# Patient Record
Sex: Male | Born: 1980 | Race: Black or African American | Hispanic: No | Marital: Single | Smoking: Former smoker
Health system: Southern US, Community
[De-identification: ages and names within clinical notes are randomized; demographics above are authoritative.]

## PROBLEM LIST (undated history)

## (undated) DIAGNOSIS — F32A Depression, unspecified: Secondary | ICD-10-CM

## (undated) DIAGNOSIS — Z21 Asymptomatic human immunodeficiency virus [HIV] infection status: Secondary | ICD-10-CM

## (undated) DIAGNOSIS — F329 Major depressive disorder, single episode, unspecified: Secondary | ICD-10-CM

## (undated) DIAGNOSIS — B20 Human immunodeficiency virus [HIV] disease: Secondary | ICD-10-CM

## (undated) HISTORY — DX: Major depressive disorder, single episode, unspecified: F32.9

## (undated) HISTORY — DX: Human immunodeficiency virus (HIV) disease: B20

## (undated) HISTORY — DX: Depression, unspecified: F32.A

## (undated) HISTORY — DX: Asymptomatic human immunodeficiency virus (hiv) infection status: Z21

---

## 2001-04-11 ENCOUNTER — Emergency Department (HOSPITAL_COMMUNITY): Admission: EM | Admit: 2001-04-11 | Discharge: 2001-04-11 | Payer: Self-pay | Admitting: Emergency Medicine

## 2001-04-27 ENCOUNTER — Emergency Department (HOSPITAL_COMMUNITY): Admission: EM | Admit: 2001-04-27 | Discharge: 2001-04-27 | Payer: Self-pay | Admitting: Emergency Medicine

## 2003-02-10 ENCOUNTER — Emergency Department (HOSPITAL_COMMUNITY): Admission: EM | Admit: 2003-02-10 | Discharge: 2003-02-10 | Payer: Self-pay | Admitting: Emergency Medicine

## 2004-03-25 ENCOUNTER — Emergency Department (HOSPITAL_COMMUNITY): Admission: EM | Admit: 2004-03-25 | Discharge: 2004-03-25 | Payer: Self-pay | Admitting: Emergency Medicine

## 2007-04-06 ENCOUNTER — Emergency Department (HOSPITAL_COMMUNITY): Admission: EM | Admit: 2007-04-06 | Discharge: 2007-04-06 | Payer: Self-pay | Admitting: Emergency Medicine

## 2008-08-12 ENCOUNTER — Emergency Department (HOSPITAL_BASED_OUTPATIENT_CLINIC_OR_DEPARTMENT_OTHER): Admission: EM | Admit: 2008-08-12 | Discharge: 2008-08-13 | Payer: Self-pay | Admitting: Emergency Medicine

## 2008-08-15 ENCOUNTER — Emergency Department (HOSPITAL_BASED_OUTPATIENT_CLINIC_OR_DEPARTMENT_OTHER): Admission: EM | Admit: 2008-08-15 | Discharge: 2008-08-15 | Payer: Self-pay | Admitting: Emergency Medicine

## 2008-08-18 ENCOUNTER — Emergency Department (HOSPITAL_BASED_OUTPATIENT_CLINIC_OR_DEPARTMENT_OTHER): Admission: EM | Admit: 2008-08-18 | Discharge: 2008-08-18 | Payer: Self-pay | Admitting: Emergency Medicine

## 2014-10-26 ENCOUNTER — Encounter (HOSPITAL_COMMUNITY): Payer: Self-pay | Admitting: *Deleted

## 2014-10-26 ENCOUNTER — Emergency Department (HOSPITAL_COMMUNITY)
Admission: EM | Admit: 2014-10-26 | Discharge: 2014-10-26 | Disposition: A | Discharge status: Home / Self Care | Payer: Self-pay | Attending: Emergency Medicine | Admitting: Emergency Medicine

## 2014-10-26 ENCOUNTER — Emergency Department (HOSPITAL_COMMUNITY): Payer: Self-pay

## 2014-10-26 DIAGNOSIS — K602 Anal fissure, unspecified: Secondary | ICD-10-CM | POA: Insufficient documentation

## 2014-10-26 DIAGNOSIS — R109 Unspecified abdominal pain: Secondary | ICD-10-CM

## 2014-10-26 DIAGNOSIS — K5900 Constipation, unspecified: Secondary | ICD-10-CM | POA: Insufficient documentation

## 2014-10-26 LAB — POC OCCULT BLOOD, ED: FECAL OCCULT BLD: POSITIVE — AB

## 2014-10-26 MED ORDER — MAGNESIUM CITRATE PO SOLN
1.0000 | Freq: Once | ORAL | Status: AC
  Administered 2014-10-26: 1 via ORAL
  Filled 2014-10-26: qty 296

## 2014-10-26 MED ORDER — LIDOCAINE 5 % EX OINT: 1.0000 "application " | TOPICAL_OINTMENT | CUTANEOUS | Status: DC | PRN

## 2014-10-26 MED ORDER — HYDROCORTISONE 1 % EX CREA: TOPICAL_CREAM | CUTANEOUS | Status: DC

## 2014-10-26 NOTE — ED Provider Notes (Signed)
CSN: [id number]     Arrival date & time 10/26/14  1509 History   First MD Initiated Contact with Patient 10/26/14 2014     Chief Complaint  Patient presents with  . Constipation  . Rectal Bleeding     (Consider location/radiation/quality/duration/timing/severity/associated sxs/prior Treatment) HPI Frank Moore is a 34 y.o. male with no medical problems, presents to ED with complaint or rectal pain blood when he wipes. States symptoms started 3 days ago. States he has been constipated and straining a lot. Patient states it's painful when he has a bowel movement and when he wipes his rectum. He denies any fever or chills. He denies any abdominal pain. No nausea or vomiting. No treatment prior to coming in.  History reviewed. No pertinent past medical history. History reviewed. No pertinent past surgical history. History reviewed. No pertinent family history. Social History  Substance Use Topics  . Smoking status: Never Smoker   . Smokeless tobacco: None  . Alcohol Use: No    Review of Systems  Constitutional: Negative for fever and chills.  Respiratory: Negative for cough, chest tightness and shortness of breath.   Cardiovascular: Negative for chest pain, palpitations and leg swelling.  Gastrointestinal: Positive for constipation, blood in stool, anal bleeding and rectal pain. Negative for nausea, vomiting, abdominal pain, diarrhea and abdominal distention.  Genitourinary: Negative for dysuria, urgency, frequency and hematuria.  Musculoskeletal: Negative for myalgias, arthralgias, neck pain and neck stiffness.  Skin: Negative for rash.  Allergic/Immunologic: Negative for immunocompromised state.  Neurological: Negative for dizziness, weakness, light-headedness, numbness and headaches.  All other systems reviewed and are negative.     Allergies  Review of patient's allergies indicates not on file.  Home Medications   Prior to Admission medications   Not on File   BP 125/75  mmHg  Pulse 71  Temp(Src) 98.8 F (37.1 C) (Oral)  Resp 18  SpO2 100% Physical Exam  Constitutional: He appears well-developed and well-nourished. No distress.  HENT:  Head: Normocephalic and atraumatic.  Eyes: Conjunctivae are normal.  Neck: Neck supple.  Cardiovascular: Normal rate, regular rhythm and normal heart sounds.   Pulmonary/Chest: Effort normal. No respiratory distress. He has no wheezes. He has no rales.  Abdominal: Soft. Bowel sounds are normal. He exhibits no distension. There is no tenderness. There is no rebound.  Genitourinary:  Fissure noted to the rectum with bright red bleeding from the fissure. No findings a digital exam. No external hemorrhoids.  Musculoskeletal: He exhibits no edema.  Neurological: He is alert.  Skin: Skin is warm and dry.  Nursing note and vitals reviewed.   ED Course  Procedures (including critical care time) Labs Review Labs Reviewed  POC OCCULT BLOOD, ED - Abnormal; Notable for the following:    Fecal Occult Bld POSITIVE (*)    All other components within normal limits    Imaging Review Dg Abd 2 Views  10/26/2014   CLINICAL DATA:  abd pain constipation and bleeding with BM  EXAM: ABDOMEN - 2 VIEW  COMPARISON:  None.  FINDINGS: The bowel gas pattern is normal. Moderate stool burden. There is no evidence of free air. No radio-opaque calculi or other significant radiographic abnormality is seen.  IMPRESSION: 1. Nonobstructed bowel gas pattern. 2. Moderate stool burden.   Electronically Signed   By: Norva Pavlov M.D.   On: 10/26/2014 21:13   I have personally reviewed and evaluated these images and lab results as part of my medical decision-making.   EKG Interpretation None  MDM   Final diagnoses:  Abdominal pain  Rectal fissure  Constipation, unspecified constipation type   Patient with constipation and rectal pain. Also reports bright red blood on tissue when he wipes. Exam shows fissure. Abdominal x-ray shows  constipation. Patient is afebrile, nontoxic appearing. Although considered rectal abscess, doubt given normal vital signs and temperature, no history of the same, and clearly visible fissure. Patient has no abdominal pain. I doubt colitis or diverticulitis as the cause of his rectal bleeding. Will treat with laxity of the stool softeners, topical lidocaine and hydrocortisone cream, sitz baths. Follow up with PCP. Return precautions discussed.   Filed Vitals:   10/26/14 2011  BP: 125/75  Pulse: 71  Temp: 98.8 F (37.1 C)  TempSrc: Oral  Resp: 18  SpO2: 100%      Jaynie Crumble, PA-C 10/26/14 2249  Richardean Canal, MD 10/27/14 [phone]

## 2014-10-26 NOTE — ED Notes (Signed)
Nurse first rounds, pt updated in wait times, no needs at this time.

## 2014-10-26 NOTE — ED Notes (Signed)
Patient transported to X-ray 

## 2014-10-26 NOTE — ED Notes (Signed)
Patient reports constipation and discomfort while trying to have bowel movement. Patient reports straining. States he has now noticed blood in his stool. Denies any abdominal pain. Pain only when using restroom.

## 2014-10-26 NOTE — Discharge Instructions (Signed)
Apply lidocaine cream for pain. Hydrocortisone cream for swelling and inflammation. Start taking daily stool softeners, soldover-the-counter. Take magnesium citrate to empty your bowels for now. Follow-up with your doctor. Return if fever, worsening pain, worsening bleeding     Anal Fissure, Adult An anal fissure is a small tear or crack in the skin around the anus. Bleeding from a fissure usually stops on its own within a few minutes. However, bleeding will often reoccur with each bowel movement until the crack heals.  CAUSES   Passing large, hard stools.  Frequent diarrheal stools.  Constipation.  Inflammatory bowel disease (Crohn's disease or ulcerative colitis).  Infections.  Anal sex. SYMPTOMS   Small amounts of blood seen on your stools, on toilet paper, or in the toilet after a bowel movement.  Rectal bleeding.  Painful bowel movements.  Itching or irritation around the anus. DIAGNOSIS Your caregiver will examine the anal area. An anal fissure can usually be seen with careful inspection. A rectal exam may be performed and a short tube (anoscope) may be used to examine the anal canal. TREATMENT   You may be instructed to take fiber supplements. These supplements can soften your stool to help make bowel movements easier.  Sitz baths may be recommended to help heal the tear. Do not use soap in the sitz baths.  A medicated cream or ointment may be prescribed to lessen discomfort. HOME CARE INSTRUCTIONS   Maintain a diet high in fruits, whole grains, and vegetables. Avoid constipating foods like bananas and dairy products.  Take sitz baths as directed by your caregiver.  Drink enough fluids to keep your urine clear or pale yellow.  Only take over-the-counter or prescription medicines for pain, discomfort, or fever as directed by your caregiver. Do not take aspirin as this may increase bleeding.  Do not use ointments containing numbing medications (anesthetics) or  hydrocortisone. They could slow healing. SEEK MEDICAL CARE IF:   Your fissure is not completely healed within 3 days.  You have further bleeding.  You have a fever.  You have diarrhea mixed with blood.  You have pain.  Your problem is getting worse rather than better. MAKE SURE YOU:   Understand these instructions.  Will watch your condition.  Will get help right away if you are not doing well or get worse. Document Released: 01/20/2005 Document Revised: 04/14/2011 Document Reviewed: 07/07/2010 [facility] Patient Information 2015 [place], [place]. This information is not intended to replace advice given to you by your health care provider. Make sure you discuss any questions you have with your health care provider.

## 2015-03-27 ENCOUNTER — Encounter (HOSPITAL_COMMUNITY): Payer: Self-pay | Admitting: *Deleted

## 2015-03-27 ENCOUNTER — Emergency Department (HOSPITAL_COMMUNITY): Payer: BLUE CROSS/BLUE SHIELD

## 2015-03-27 ENCOUNTER — Emergency Department (HOSPITAL_COMMUNITY)
Admission: EM | Admit: 2015-03-27 | Discharge: 2015-03-27 | Disposition: A | Discharge status: Home / Self Care | Payer: BLUE CROSS/BLUE SHIELD | Attending: Emergency Medicine | Admitting: Emergency Medicine

## 2015-03-27 DIAGNOSIS — Y9389 Activity, other specified: Secondary | ICD-10-CM | POA: Diagnosis not present

## 2015-03-27 DIAGNOSIS — Y990 Civilian activity done for income or pay: Secondary | ICD-10-CM | POA: Insufficient documentation

## 2015-03-27 DIAGNOSIS — S79911A Unspecified injury of right hip, initial encounter: Secondary | ICD-10-CM | POA: Diagnosis present

## 2015-03-27 DIAGNOSIS — Y9289 Other specified places as the place of occurrence of the external cause: Secondary | ICD-10-CM | POA: Insufficient documentation

## 2015-03-27 DIAGNOSIS — Z7952 Long term (current) use of systemic steroids: Secondary | ICD-10-CM | POA: Insufficient documentation

## 2015-03-27 DIAGNOSIS — X500XXA Overexertion from strenuous movement or load, initial encounter: Secondary | ICD-10-CM | POA: Insufficient documentation

## 2015-03-27 DIAGNOSIS — S76011A Strain of muscle, fascia and tendon of right hip, initial encounter: Secondary | ICD-10-CM | POA: Insufficient documentation

## 2015-03-27 DIAGNOSIS — M25551 Pain in right hip: Secondary | ICD-10-CM

## 2015-03-27 MED ORDER — METHOCARBAMOL 500 MG PO TABS
1000.0000 mg | ORAL_TABLET | Freq: Once | ORAL | Status: AC
  Administered 2015-03-27: 1000 mg via ORAL
  Filled 2015-03-27: qty 2

## 2015-03-27 MED ORDER — OXYCODONE-ACETAMINOPHEN 5-325 MG PO TABS
1.0000 | ORAL_TABLET | Freq: Once | ORAL | Status: AC
  Administered 2015-03-27: 1 via ORAL
  Filled 2015-03-27: qty 1

## 2015-03-27 MED ORDER — METHOCARBAMOL 500 MG PO TABS: 500.0000 mg | ORAL_TABLET | Freq: Two times a day (BID) | ORAL | Status: DC

## 2015-03-27 MED ORDER — OXYCODONE-ACETAMINOPHEN 5-325 MG PO TABS: 1.0000 | ORAL_TABLET | ORAL | Status: DC | PRN

## 2015-03-27 MED ORDER — IBUPROFEN 800 MG PO TABS: 800.0000 mg | ORAL_TABLET | Freq: Three times a day (TID) | ORAL | Status: DC

## 2015-03-27 MED ORDER — KETOROLAC TROMETHAMINE 60 MG/2ML IM SOLN
60.0000 mg | Freq: Once | INTRAMUSCULAR | Status: AC
  Administered 2015-03-27: 60 mg via INTRAMUSCULAR
  Filled 2015-03-27: qty 2

## 2015-03-27 NOTE — ED Notes (Signed)
Patient transported to X-ray 

## 2015-03-27 NOTE — Discharge Instructions (Signed)
You have been seen today for hip pain. Your imaging showed no abnormalities. Follow up with PCP for chronic management of this issue. Return to ED should symptoms worsen or you begin to have symptoms such as fever/chills, nausea/vomiting, abdominal pain, testicular pain, numbness/tingling/weakness, or any other major concerns. Apply ice and take ibuprofen for pain and anti-inflammatory management. Decrease stress on the hip. Take it easy at work.   Emergency Department Resource Guide 1) Find a Doctor and Pay Out of Pocket Although you won't have to find out who is covered by your insurance plan, it is a good idea to ask around and get recommendations. You will then need to call the office and see if the doctor you have chosen will accept you as a new patient and what types of options they offer for patients who are self-pay. Some doctors offer discounts or will set up payment plans for their patients who do not have insurance, but you will need to ask so you aren't surprised when you get to your appointment.  2) Contact Your Local Health Department Not all health departments have doctors that can see patients for sick visits, but many do, so it is worth a call to see if yours does. If you don't know where your local health department is, you can check in your phone book. The CDC also has a tool to help you locate your state's health department, and many state websites also have listings of all of their local health departments.  3) Find a Walk-in Clinic If your illness is not likely to be very severe or complicated, you may want to try a walk in clinic. These are popping up all over the country in pharmacies, drugstores, and shopping centers. They're usually staffed by nurse practitioners or physician assistants that have been trained to treat common illnesses and complaints. They're usually fairly quick and inexpensive. However, if you have serious medical issues or chronic medical problems, these are  probably not your best option.  No Primary Care Doctor: - Call Health Connect at  [phone] - they can help you locate a primary care doctor that  accepts your insurance, provides certain services, etc. - Physician Referral Service- [phone]  Chronic Pain Problems: Organization         Address  Phone   Notes  [facility]  [phone] Patients need to be referred by their primary care doctor.   Medication Assistance: Organization         Address  Phone   Notes  [facility] Medication [facility] [address] [place] [place], [place] [address] [phone] --Must be a resident of [facility] -- Must have NO insurance coverage whatsoever (no Medicaid/ Medicare, etc.) -- The pt. MUST have a primary care doctor that directs their care regularly and follows them in the community   [facility]  [phone]   [employer]  [phone]    Agencies that provide inexpensive medical care: Organization         Address  Phone   Notes  [facility]  [phone]   [facility]    [phone]   [facility] [address] [place], [place] [address] [phone]   [facility] [address], [place] [phone]   Planned Parenthood    [phone]   [facility]    [phone]   [facility]  [address], [place] Phone:  [phone], Fax:  [phone] Hours of Operation:  9 am - 6 pm, M-F.  Also accepts Medicaid/Medicare and self-pay.  [facility]  [address], [place] Phone: [phone], Fax: [phone]. Hours of Operation:  8:30 am - 5:30 pm, M-F.  Also accepts Medicaid and self-pay.  [facility] [place] [address] [place] Phone: [phone]   [facility] [address] Natasha Bence [place], [place] [phone], Ext. [phone] Mondays & Thursdays:  7-9 AM.  First 15 patients are seen on a first come, first serve basis.    Medicaid-accepting [facility] Providers:  Organization         Address  Phone   Notes  [facility] [address],  [phone] Also accepts self-pay patients.  [facility] [address] Laurell Josephs [place], [place]  [phone]   [facility] [address], [place] [phone]   [facility] [address] [place] [phone]   Renaye Rakers [address], [place]   [phone] Only accepts [place] patients after they have their name applied to their card.   Self-Pay (no insurance) in [facility]:  Organization         Address  Phone   Notes  Sickle Cell Patients, [facility] [address] [place], [place] [phone]   [facility] Care [address] [place], [place] [phone]   [facility] [place]  [address], [place] [phone]   [facility]/Dr. Osei-Bonsu  [address] [place] or [address], [place] [phone] Phone number for both [place] and [place] locations is the same.  [facility] [address], [place] [phone]   [facility] [address] [place] or [address] [phone]   [facility] [address] [place] [phone], phone; [phone], fax Sees patients 1st and 3rd Saturday of every month.  Must not qualify for public or private insurance (i.e. Medicaid, Medicare, [place] Health Choice, Veterans' Benefits)  Household income should be no more than 200% of the poverty level The clinic cannot treat you if you are pregnant or think you are pregnant  Sexually transmitted diseases are not treated at the clinic.     Dental Care: Organization         Address  Phone  Notes  [facility] of [facility] [address] [place], [place] [phone] Accepts children up to age 25 who are enrolled in [place] or [place] Health Choice; pregnant women with a Medicaid card; and children who have applied for Medicaid or [place] Health Choice, but were declined, whose parents can pay a reduced fee at time of service.  [facility] Department of [facility]  [address], [place] [phone] Accepts children up to age 7 who are enrolled in [place] or [place] Health Choice; pregnant women with a Medicaid card; and children who have applied for Medicaid or  Health Choice, but were declined, whose parents can pay a reduced fee at time of service.  [facility] Adult Dental Access PROGRAM  [address] [place], [place] [phone]  Patients are seen by appointment only. Walk-ins are not accepted. [facility] will see patients 36 years of age and older. Monday - Tuesday (8am-5pm) Most Wednesdays (8:30-5pm) $30 per visit, cash only  [facility]  [address], [facility] [phone] Patients are seen by appointment only. Walk-ins are not accepted. [facility] will see patients 36 years of age and older. One Wednesday Evening (Monthly: Volunteer Based).  $30 per visit, cash only  [employer] [facility]  [phone] for adults; Children under age 634, call Graduate Pediatric Dentistry at [phone]. Children aged 924-14, please call [phone] to request a pediatric application.  Dental services are provided in all areas of dental care including fillings, crowns and bridges, complete and partial dentures, implants, gum treatment, root canals, and extractions. Preventive care is also provided. Treatment is provided to both adults and children. Patients are selected via a lottery and there is often a waiting  list.   [facility] [address], [place]  [phone] [email or web address]   [facility] [address], [place], [place] [phone], Ext. [phone] Second and Fourth Thursday of each month, opens at 6:30 AM; Clinic ends at 9 AM.  Patients are seen on a first-come first-served basis, and a limited number are seen during each clinic.   [facility]  [address], [place], [place] [phone]   Eligibility Requirements You must have lived in [place], [place], or [place] counties for at least the last three months.   You cannot be eligible for state or federal sponsored [place] Cityhealthcare insurance, including CIGNAVeterans Administration, [place]icaid, or [employer]icare.   You generally cannot be eligible for healthcare insurance through your employer.    How to apply: Eligibility screenings are held every Tuesday and Wednesday afternoon from 1:00 pm until 4:00 pm. You do not need an appointment for the interview!  [facility] [address], [place], [place] [phone]   [facility]  [phone]   [facility]  [phone]   [facility]  [phone]    Behavioral Health Resources in the Community: Intensive Outpatient Programs Organization         Address  Phone  Notes  [facility] [address], [place], [place] [phone]   [facility] [address], [place], [place] [phone]   [facility] [address], [place], [place]  [phone]   [facility] [address],  [place], [place] [phone] or [phone]   Substance Abuse Resources Organization         Address  Phone  Notes  [facility]  [phone]   [facility]  [phone]   [facility]  [phone]   [facility]  [phone]   Residential & Outpatient Substance Abuse Program   [phone]   Psychological Services Organization         Address  Phone  Notes  [facility]  [phone]- [phone]   [facility]  [phone]- [phone]   [facility] [address], [place] [phone] or [phone]    Mobile Crisis Teams Organization         Address  Phone  Notes  [facility], Mobile Crisis Care Unit  [phone]   [facility]  [address] [place], [place] [phone]   [facility] [address], [place]  [address] [place] [place] [phone]    Self-Help/Support Groups Organization         Address  Phone             Notes  [facility] [place] - variety of support groups  [phone]- [phone] Call for more information  Narcotics Anonymous (NA), [facility] [address], [employer] [place]  2 meetings at this location   Statistician         Address  Phone  Notes  [facility] [address],    [place]  [phone]   [facility]  [address], [place] [address], [place], [place] [phone]   [facility] [address] [place], [place] [phone] Admissions: 8am-3pm M-F  [facility] [address]    [place], [place] [phone]   [facility] [address] [place], [place], [place] [phone]   [facility] [address],  [place], [place] [phone]   [facility] - Intensive Outpatient [address]ure[address] Josephs [address], [place], [place] [phone]   [facility] ([facility] [address]  [place], [place] [phone] or [phone]   [facility] ([facility]) [address], [place], [place] [phone] Accepts Medicaid  [facility] [address],  [place] [phone] Substance Abuse/Addiction Treatment   [facility] Organization          Address  Phone  Notes  [facility]  [phone]   Angie Fava, PhD [address] Ervin Knack [place], [place]   [phone] or [phone]   [facility]   [address] [place], [place] [phone]   [facility] [address] [place], [place] [phone] Insurance/Medicaid/sponsorship through [facility] and Families [address]                                    [place], [place] [phone] Therapy/tele-psych/case  [facility] [address], [place] [phone]    Dr. Lolly Mustache  [phone]   [facility]  [employer] [facility]. 1) [address], [place] 2) [address] [place] 3)  [address], [place] [phone]  [phone]   [facility] Child Abuse Hotline [phone] or [phone] (After Hours)

## 2015-03-27 NOTE — ED Provider Notes (Signed)
CSN: [id number]     Arrival date & time 03/27/15  4696 History   None    Chief Complaint  Patient presents with  . Leg Pain     (Consider location/radiation/quality/duration/timing/severity/associated sxs/prior Treatment) HPI   Frank Moore is a 35 y.o. male, patient with no pertinent past medical history, presenting to the ED with right hip pain that began yesterday. Patient states that he has been doing increased lifting at work the last few days. Patient rates his pain at 10 out of 10, throbbing, nonradiating. Patient has tried ibuprofen and heat without relief. Patient denies fever/chills, has nausea/vomiting, abdominal pain, neuro deficits, pain extending into the testicles or groin, or any other complaints.  History reviewed. No pertinent past medical history. History reviewed. No pertinent past surgical history. No family history on file. Social History  Substance Use Topics  . Smoking status: Never Smoker   . Smokeless tobacco: None  . Alcohol Use: No    Review of Systems  Constitutional: Negative for fever, chills and diaphoresis.  Gastrointestinal: Negative for abdominal pain.  Genitourinary: Negative for dysuria and testicular pain.  Musculoskeletal: Positive for arthralgias (Right hip).  Skin: Negative for color change and pallor.  Neurological: Negative for weakness and numbness.      Allergies  Review of patient's allergies indicates no known allergies.  Home Medications   Prior to Admission medications   Medication Sig Start Date End Date Taking? Authorizing Provider  hydrocortisone cream 1 % Apply to affected area 2 times daily 10/26/14   Tatyana Kirichenko, PA-C  ibuprofen (ADVIL,MOTRIN) 800 MG tablet Take 1 tablet (800 mg total) by mouth 3 (three) times daily. 03/27/15   Jaslynn Thome C Latiqua Daloia, PA-C  lidocaine (XYLOCAINE) 5 % ointment Apply 1 application topically as needed. 10/26/14   Tatyana Kirichenko, PA-C  methocarbamol (ROBAXIN) 500 MG tablet Take 1 tablet (500  mg total) by mouth 2 (two) times daily. 03/27/15   Mattia Osterman C Chael Urenda, PA-C  oxyCODONE-acetaminophen (PERCOCET/ROXICET) 5-325 MG tablet Take 1-2 tablets by mouth every 4 (four) hours as needed for severe pain. 03/27/15   Vasili Fok C Jamala Kohen, PA-C   BP 127/87 mmHg  Pulse 74  Temp(Src) 98.1 F (36.7 C) (Oral)  Resp 16  SpO2 98% Physical Exam  Constitutional: He appears well-developed and well-nourished. No distress.  HENT:  Head: Normocephalic and atraumatic.  Eyes: Conjunctivae are normal.  Cardiovascular: Normal rate and regular rhythm.   Pulmonary/Chest: Effort normal.  Abdominal: Soft. There is no tenderness.  Musculoskeletal:  Full range of motion, but painful. Palpation of the right anterior hip elicits increased pain. No erythema, swelling, deformity, increased warmth, or any other abnormalities.  Neurological: He is alert. He has normal reflexes.  No sensory deficits. Strength 5/5. Coordination intact. Limping gait due to pain.  Skin: Skin is warm and dry. He is not diaphoretic.  Nursing note and vitals reviewed.   ED Course  Procedures (including critical care time)  Imaging Review Dg Hip Unilat With Pelvis 2-3 Views Right  03/27/2015  CLINICAL DATA:  Right groin pain, no known injury EXAM: DG HIP (WITH OR WITHOUT PELVIS) 2-3V RIGHT COMPARISON:  None. FINDINGS: Three views of the right hip submitted. No acute fracture or subluxation. No radiopaque foreign body. Bilateral hip joints are symmetrical in appearance. Pubic symphysis is unremarkable. IMPRESSION: Negative. Electronically Signed   By: Natasha Mead M.D.   On: 03/27/2015 09:26   I have personally reviewed and evaluated these images as part of my medical decision-making.   EKG  Interpretation None      MDM   Final diagnoses:  Hip pain, right  Strain of muscle of right hip, initial encounter    Frank Moore presents with right hip pain since yesterday.  Suspect that the patient has inflammation due to overuse. Low suspicion  for septic joint with no risk factors. No neuro deficits. Patient has no red flag symptoms. Pain management, anti-inflammatories, and ice. No trauma, but patient requested an xray. Pt improved with conservative management here in the ED. The patient was given instructions for home care as well as return precautions. Patient voices understanding of these instructions, accepts the plan, and is comfortable with discharge.    Anselm Pancoast, PA-C 03/27/15 0981  Alvira Monday, MD 03/29/15 1220

## 2015-03-27 NOTE — Progress Notes (Signed)
Orthopedic Tech Progress Note Patient Details:  Frank Moore 03-15-1980 [id number]  Ortho Devices Type of Ortho Device: Crutches Ortho Device/Splint Interventions: Application   Saul Fordyce 03/27/2015, 9:21 AM

## 2015-03-27 NOTE — ED Notes (Signed)
Pt reports lifting a lot of heavy items at work lately. Pt reports pain in hip and leg. Denies nay bowel or bladder changes.

## 2015-05-06 ENCOUNTER — Ambulatory Visit (INDEPENDENT_AMBULATORY_CARE_PROVIDER_SITE_OTHER): Payer: BLUE CROSS/BLUE SHIELD | Admitting: Family Medicine

## 2015-05-06 VITALS — BP 118/78 | HR 73 | Temp 98.5°F | Resp 16 | Ht 70.0 in | Wt 174.0 lb

## 2015-05-06 DIAGNOSIS — J029 Acute pharyngitis, unspecified: Secondary | ICD-10-CM | POA: Diagnosis not present

## 2015-05-06 DIAGNOSIS — R509 Fever, unspecified: Secondary | ICD-10-CM

## 2015-05-06 DIAGNOSIS — R51 Headache: Secondary | ICD-10-CM

## 2015-05-06 DIAGNOSIS — R519 Headache, unspecified: Secondary | ICD-10-CM

## 2015-05-06 LAB — POCT RAPID STREP A (OFFICE): RAPID STREP A SCREEN: NEGATIVE

## 2015-05-06 MED ORDER — AMOXICILLIN 875 MG PO TABS: 875.0000 mg | ORAL_TABLET | Freq: Two times a day (BID) | ORAL | Status: DC

## 2015-05-06 NOTE — Progress Notes (Addendum)
Subjective:    Patient ID: Frank Moore, male    DOB: 06-27-1980, 35 y.o.   MRN: [id number] By signing my name below, I, Littie Deeds, attest that this documentation has been prepared under the direction and in the presence of Meredith Staggers, MD.  Electronically Signed: Littie Deeds, Medical Scribe. 05/06/2015. 2:20 PM.  HPI HPI Comments: Frank Moore is a 35 y.o. male who presents to the [facility] complaining of gradual onset sore throat that started 4 days ago. Patient reports having associated frontal headaches intermittently. He had a fever at 100.7 F two days ago. He has tried Theraflu, Benadryl, OTC sinus medications, ibuprofen, and throat lozenges. Patient denies cough, rhinorrhea, weakness, and visual changes. He also denies sick contacts, but he does work at a hotel.  There are no active problems to display for this patient.  History reviewed. No pertinent past medical history. History reviewed. No pertinent past surgical history. No Known Allergies Prior to Admission medications   Medication Sig Start Date End Date Taking? Authorizing Provider  hydrocortisone cream 1 % Apply to affected area 2 times daily Patient not taking: Reported on 05/06/2015 10/26/14   Tatyana Kirichenko, PA-C  ibuprofen (ADVIL,MOTRIN) 800 MG tablet Take 1 tablet (800 mg total) by mouth 3 (three) times daily. Patient not taking: Reported on 05/06/2015 03/27/15   Hillard Danker Joy, PA-C  methocarbamol (ROBAXIN) 500 MG tablet Take 1 tablet (500 mg total) by mouth 2 (two) times daily. Patient not taking: Reported on 05/06/2015 03/27/15   Anselm Pancoast, PA-C  oxyCODONE-acetaminophen (PERCOCET/ROXICET) 5-325 MG tablet Take 1-2 tablets by mouth every 4 (four) hours as needed for severe pain. Patient not taking: Reported on 05/06/2015 03/27/15   Anselm Pancoast, PA-C   Social History   Social History  . Marital Status: Single    Spouse Name: N/A  . Number of Children: N/A  . Years of Education: N/A    Occupational History  . Not on file.   Social History Main Topics  . Smoking status: Never Smoker   . Smokeless tobacco: Not on file  . Alcohol Use: No  . Drug Use: Not on file  . Sexual Activity: Not on file   Other Topics Concern  . Not on file   Social History Narrative     Review of Systems  Constitutional: Positive for fever.  HENT: Positive for sore throat. Negative for rhinorrhea.   Eyes: Negative for visual disturbance.  Respiratory: Negative for cough.   Neurological: Positive for headaches. Negative for weakness.       Objective:   Physical Exam  Constitutional: He is oriented to person, place, and time. He appears well-developed and well-nourished.  HENT:  Head: Normocephalic and atraumatic.  Right Ear: Tympanic membrane, external ear and ear canal normal.  Left Ear: External ear and ear canal normal. Tympanic membrane is erythematous.  No middle ear effusion.  Nose: No rhinorrhea. Right sinus exhibits no maxillary sinus tenderness and no frontal sinus tenderness. Left sinus exhibits no maxillary sinus tenderness and no frontal sinus tenderness.  Mouth/Throat: Oropharynx is clear and moist and mucous membranes are normal. No oropharyngeal exudate or posterior oropharyngeal erythema.  Left TM with minimal effusion. Slight cryptic tonsils with minimal erythema. No oral lesions. Sinuses non-tender.  Eyes: Conjunctivae are normal. Pupils are equal, round, and reactive to light.  Neck: Neck supple.  Slightly enlarged AC nodes, right greater than left.  Cardiovascular: Normal rate, regular rhythm, normal heart sounds and intact  distal pulses.   No murmur heard. Pulmonary/Chest: Effort normal and breath sounds normal. He has no wheezes. He has no rhonchi. He has no rales.  Abdominal: Soft. There is no tenderness.  Neurological: He is alert and oriented to person, place, and time.  Skin: Skin is warm and dry. No rash noted.  Psychiatric: He has a normal mood and  affect. His behavior is normal.  Vitals reviewed.   Filed Vitals:   05/06/15 1418  BP: 118/78  Pulse: 73  Temp: 98.5 F (36.9 C)  TempSrc: Oral  Resp: 16  Height: 5\' 10"  (1.778 m)  Weight: 174 lb (78.926 kg)  SpO2: 98%    Results for orders placed or performed in visit on 05/06/15  POCT rapid strep A  Result Value Ref Range   Rapid Strep A Screen Negative Negative      Assessment & Plan:   Wyvonnia LoraCorey Korber is a 35 y.o. male Sore throat - Plan: POCT rapid strep A, Culture, Group A Strep, amoxicillin (AMOXIL) 875 MG tablet  Frontal headache - Plan: Culture, Group A Strep, amoxicillin (AMOXIL) 875 MG tablet  Fever, unspecified - Plan: POCT rapid strep A, Culture, Group A Strep, amoxicillin (AMOXIL) 875 MG tablet  Possible false-negative strep testing versus other type of strep with typical symptoms. Decided to start amoxicillin, check throat culture, symptomatic care, RTC precautions if worsening. If throat culture negative, and symptoms have resolved, could consider D/C antibiotic.   Meds ordered this encounter  Medications  . amoxicillin (AMOXIL) 875 MG tablet    Sig: Take 1 tablet (875 mg total) by mouth 2 (two) times daily.    Dispense:  20 tablet    Refill:  0   Patient Instructions       IF you received an x-ray today, you will receive an invoice from [facility]. Please contact [facility] at [phone] with questions or concerns regarding your invoice.   IF you received labwork today, you will receive an invoice from [facility]. Please contact [facility] at [phone] with questions or concerns regarding your invoice.   Our billing staff will not be able to assist you with questions regarding bills from these companies.  You will be contacted with the lab results as soon as they are available. The fastest way to get your results is to activate your My Chart account. Instructions are located on the last page of this  paperwork. If you have not heard from us regarding the results in 2 weeks, please contact this office.     Okay to start antibiotic now, we will check a throat culture that is more sensitive for strep throat. For headache, can take Tylenol, or ibuprofen over-the-counter. Follow instructions on those packages, and be careful combining these with other cold medicines that may also have those ingredients in them. If your headaches are not improving in the next 2-3 days, or worsening sooner, recommend recheck here, your primary care provider, or the emergency room.  Return to the clinic or go to the nearest emergency room if any of your symptoms worsen or new symptoms occur.  Sore Throat A sore throat is pain, burning, irritation, or scratchiness of the throat. There is often pain or tenderness when swallowing or talking. A sore throat may be accompanied by other symptoms, such as coughing, sneezing, fever, and swollen neck glands. A sore throat is often the first sign of another sickness, such as a cold, flu, strep throat, or mononucleosis (commonly known as mono).  Most sore throats go away without medical treatment. CAUSES  The most common causes of a sore throat include:  A viral infection, such as a cold, flu, or mono.  A bacterial infection, such as strep throat, tonsillitis, or whooping cough.  Seasonal allergies.  Dryness in the air.  Irritants, such as smoke or pollution.  Gastroesophageal reflux disease (GERD). HOME CARE INSTRUCTIONS   Only take over-the-counter medicines as directed by your caregiver.  Drink enough fluids to keep your urine clear or pale yellow.  Rest as needed.  Try using throat sprays, lozenges, or sucking on hard candy to ease any pain (if older than 4 years or as directed).  Sip warm liquids, such as broth, herbal tea, or warm water with honey to relieve pain temporarily. You may also eat or drink cold or frozen liquids such as frozen ice pops.  Gargle  with salt water (mix 1 tsp salt with 8 oz of water).  Do not smoke and avoid secondhand smoke.  Put a cool-mist humidifier in your bedroom at night to moisten the air. You can also turn on a hot shower and sit in the bathroom with the door closed for 5-10 minutes. SEEK IMMEDIATE MEDICAL CARE IF:  You have difficulty breathing.  You are unable to swallow fluids, soft foods, or your saliva.  You have increased swelling in the throat.  Your sore throat does not get better in 7 days.  You have nausea and vomiting.  You have a fever or persistent symptoms for more than 2-3 days.  You have a fever and your symptoms suddenly get worse. MAKE SURE YOU:   Understand these instructions.  Will watch your condition.  Will get help right away if you are not doing well or get worse.   This information is not intended to replace advice given to you by your health care provider. Make sure you discuss any questions you have with your health care provider.   Document Released: 02/28/2004 Document Revised: 02/10/2014 Document Reviewed: 09/28/2011 Elsevier Interactive Patient Education [employer]     I personally performed the services described in this documentation, which was scribed in my presence. The recorded information has been reviewed and considered, and addended by me as needed.

## 2015-05-06 NOTE — Patient Instructions (Addendum)
IF you received an x-ray today, you will receive an invoice from [facility]. Please contact [facility] at [phone] with questions or concerns regarding your invoice.   IF you received labwork today, you will receive an invoice from [facility]. Please contact [facility] at [phone] with questions or concerns regarding your invoice.   Our billing staff will not be able to assist you with questions regarding bills from these companies.  You will be contacted with the lab results as soon as they are available. The fastest way to get your results is to activate your My Chart account. Instructions are located on the last page of this paperwork. If you have not heard from us regarding the results in 2 weeks, please contact this office.     Okay to start antibiotic now, we will check a throat culture that is more sensitive for strep throat. For headache, can take Tylenol, or ibuprofen over-the-counter. Follow instructions on those packages, and be careful combining these with other cold medicines that may also have those ingredients in them. If your headaches are not improving in the next 2-3 days, or worsening sooner, recommend recheck here, your primary care provider, or the emergency room.  Return to the clinic or go to the nearest emergency room if any of your symptoms worsen or new symptoms occur.  Sore Throat A sore throat is pain, burning, irritation, or scratchiness of the throat. There is often pain or tenderness when swallowing or talking. A sore throat may be accompanied by other symptoms, such as coughing, sneezing, fever, and swollen neck glands. A sore throat is often the first sign of another sickness, such as a cold, flu, strep throat, or mononucleosis (commonly known as mono). Most sore throats go away without medical treatment. CAUSES  The most common causes of a sore throat include:  A viral infection, such as a cold, flu, or  mono.  A bacterial infection, such as strep throat, tonsillitis, or whooping cough.  Seasonal allergies.  Dryness in the air.  Irritants, such as smoke or pollution.  Gastroesophageal reflux disease (GERD). HOME CARE INSTRUCTIONS   Only take over-the-counter medicines as directed by your caregiver.  Drink enough fluids to keep your urine clear or pale yellow.  Rest as needed.  Try using throat sprays, lozenges, or sucking on hard candy to ease any pain (if older than 4 years or as directed).  Sip warm liquids, such as broth, herbal tea, or warm water with honey to relieve pain temporarily. You may also eat or drink cold or frozen liquids such as frozen ice pops.  Gargle with salt water (mix 1 tsp salt with 8 oz of water).  Do not smoke and avoid secondhand smoke.  Put a cool-mist humidifier in your bedroom at night to moisten the air. You can also turn on a hot shower and sit in the bathroom with the door closed for 5-10 minutes. SEEK IMMEDIATE MEDICAL CARE IF:  You have difficulty breathing.  You are unable to swallow fluids, soft foods, or your saliva.  You have increased swelling in the throat.  Your sore throat does not get better in 7 days.  You have nausea and vomiting.  You have a fever or persistent symptoms for more than 2-3 days.  You have a fever and your symptoms suddenly get worse. MAKE SURE YOU:   Understand these instructions.  Will watch your condition.  Will get help right away if you are not doing  well or get worse.   This information is not intended to replace advice given to you by your health care provider. Make sure you discuss any questions you have with your health care provider.   Document Released: 02/28/2004 Document Revised: 02/10/2014 Document Reviewed: 09/28/2011 Elsevier Interactive Patient Education [employer]

## 2015-05-08 LAB — CULTURE, GROUP A STREP: Organism ID, Bacteria: NORMAL

## 2015-10-20 ENCOUNTER — Telehealth: Payer: Self-pay

## 2015-10-20 ENCOUNTER — Ambulatory Visit (INDEPENDENT_AMBULATORY_CARE_PROVIDER_SITE_OTHER): Payer: Self-pay | Admitting: Urgent Care

## 2015-10-20 VITALS — BP 122/86 | HR 69 | Temp 98.0°F | Resp 18 | Ht 69.5 in | Wt 164.0 lb

## 2015-10-20 DIAGNOSIS — R21 Rash and other nonspecific skin eruption: Secondary | ICD-10-CM

## 2015-10-20 DIAGNOSIS — F419 Anxiety disorder, unspecified: Principal | ICD-10-CM

## 2015-10-20 DIAGNOSIS — Z638 Other specified problems related to primary support group: Secondary | ICD-10-CM

## 2015-10-20 DIAGNOSIS — R634 Abnormal weight loss: Secondary | ICD-10-CM

## 2015-10-20 DIAGNOSIS — Z7251 High risk heterosexual behavior: Secondary | ICD-10-CM

## 2015-10-20 DIAGNOSIS — R0789 Other chest pain: Secondary | ICD-10-CM

## 2015-10-20 DIAGNOSIS — F329 Major depressive disorder, single episode, unspecified: Secondary | ICD-10-CM

## 2015-10-20 DIAGNOSIS — F439 Reaction to severe stress, unspecified: Secondary | ICD-10-CM

## 2015-10-20 DIAGNOSIS — F418 Other specified anxiety disorders: Secondary | ICD-10-CM

## 2015-10-20 LAB — POCT CBC
Granulocyte percent: 57.9 %G (ref 37–80)
HCT, POC: 39.8 % — AB (ref 43.5–53.7)
Hemoglobin: 13.6 g/dL — AB (ref 14.1–18.1)
Lymph, poc: 2 (ref 0.6–3.4)
MCH: 26.9 pg — AB (ref 27–31.2)
MCHC: 34.2 g/dL (ref 31.8–35.4)
MCV: 78.6 fL — AB (ref 80–97)
MID (CBC): 0.4 (ref 0–0.9)
MPV: 8.1 fL (ref 0–99.8)
PLATELET COUNT, POC: 161 10*3/uL (ref 142–424)
POC Granulocyte: 3.4 (ref 2–6.9)
POC LYMPH PERCENT: 34.9 %L (ref 10–50)
POC MID %: 7.2 %M (ref 0–12)
RBC: 5.06 M/uL (ref 4.69–6.13)
RDW, POC: 13.7 %
WBC: 5.8 10*3/uL (ref 4.6–10.2)

## 2015-10-20 LAB — TSH: TSH: 0.72 m[IU]/L (ref 0.40–4.50)

## 2015-10-20 LAB — POCT SKIN KOH: SKIN KOH, POC: POSITIVE

## 2015-10-20 MED ORDER — HYDROXYZINE HCL 25 MG PO TABS: 25.0000 mg | ORAL_TABLET | Freq: Every evening | ORAL | 5 refills | Status: DC | PRN

## 2015-10-20 MED ORDER — FLUOXETINE HCL 20 MG PO TABS
20.0000 mg | ORAL_TABLET | Freq: Every day | ORAL | 3 refills | Status: DC
Start: 2015-10-20 — End: 2016-05-06

## 2015-10-20 MED ORDER — FLUCONAZOLE 200 MG PO TABS: 200.0000 mg | ORAL_TABLET | ORAL | 1 refills | Status: DC

## 2015-10-20 NOTE — Progress Notes (Signed)
MRN: [id number] DOB: 03/08/1980  Subjective:   Frank Moore is a 35 y.o. male presenting for chief complaint of Stress; Anxiety; and Depression (See sceening)  Reports 2 week history of significant stress, anxiety and depression. Patient just lost his home, he is financially dependent with his 2 roommates. Has a difficult living situation in general and has decided to room with a partner that is very controlling per patient. His stress has become significant enough that he has started to feel chest pain especially when he is dealing with his living situation. Has low energy, has been losing weight, ~20lbs in the past 1-2 months. Denies SI, HI. His family history is positive for stroke with his father in his 3630's. Smokes marijuana once per day, smoking off and on for 20 years. Patient is sexually active with his 1 male partner, does not always use protection.  Frank Moore currently has no medications in their medication list. Also has No Known Allergies.  Frank Moore  has no past medical history on file. Also  has no past surgical history on file.  Objective:   Vitals: BP 122/86   Pulse 69   Temp 98 F (36.7 C) (Oral)   Resp 18   Ht 5' 9.5" (1.765 m)   Wt 164 lb (74.4 kg)   SpO2 100%   BMI 23.87 kg/m   Physical Exam  Constitutional: He is oriented to person, place, and time. He appears well-developed and well-nourished.  HENT:  Mouth/Throat: Oropharynx is clear and moist.  Eyes: No scleral icterus.  Neck: Normal range of motion. Neck supple. No thyromegaly present.  Cardiovascular: Normal rate, regular rhythm and intact distal pulses.  Exam reveals no gallop and no friction rub.   No murmur heard. Pulmonary/Chest: No respiratory distress. He has no wheezes. He has no rales.  Neurological: He is alert and oriented to person, place, and time.  Skin: Skin is warm and dry.   ECG interpretation - normal sinus rhythm.  Results for orders placed or performed in visit on 10/20/15 (from the  past 24 hour(s))  POCT CBC     Status: Abnormal   Collection Time: 10/20/15 12:17 PM  Result Value Ref Range   WBC 5.8 4.6 - 10.2 K/uL   Lymph, poc 2.0 0.6 - 3.4   POC LYMPH PERCENT 34.9 10 - 50 %L   MID (cbc) 0.4 0 - 0.9   POC MID % 7.2 0 - 12 %M   POC Granulocyte 3.4 2 - 6.9   Granulocyte percent 57.9 37 - 80 %G   RBC 5.06 4.69 - 6.13 M/uL   Hemoglobin 13.6 (A) 14.1 - 18.1 g/dL   HCT, POC 29.539.8 (A) 28.443.5 - 53.7 %   MCV 78.6 (A) 80 - 97 fL   MCH, POC 26.9 (A) 27 - 31.2 pg   MCHC 34.2 31.8 - 35.4 g/dL   RDW, POC 13.213.7 %   Platelet Count, POC 161 142 - 424 K/uL   MPV 8.1 0 - 99.8 fL  POCT Skin KOH     Status: None   Collection Time: 10/20/15 12:18 PM  Result Value Ref Range   Skin KOH, POC Positive    Assessment and Plan :   1. Anxiety and depression 2. Stress at home 3. Atypical chest pain 4. Loss of weight - Will have patient start Prozac for management of his anxiety and depression. Use Vistaril as needed for anxiety and sleep. Recheck in 2 months. If patient tolerates medication well, he is  to increase to 40mg  once daily.  5. High risk sexual behavior - I strongly advised patient to report to the HD for STI screening.  6. Rash and nonspecific skin eruption - Start diflucan, once weekly.  Wallis Bamberg, PA-C [facility] [phone] 10/20/2015 11:47 AM

## 2015-10-20 NOTE — Patient Instructions (Addendum)
Fluoxetine capsules or tablets (Depression/Mood Disorders) What is this medicine? FLUOXETINE (floo OX e teen) belongs to a class of drugs known as selective serotonin reuptake inhibitors (SSRIs). It helps to treat mood problems such as depression, obsessive compulsive disorder, and panic attacks. It can also treat certain eating disorders. This medicine may be used for other purposes; ask your health care provider or pharmacist if you have questions. What should I tell my health care provider before I take this medicine? They need to know if you have any of these conditions: -bipolar disorder or mania -diabetes -glaucoma -liver disease -psychosis -seizures -suicidal thoughts or history of attempted suicide -an unusual or allergic reaction to fluoxetine, other medicines, foods, dyes, or preservatives -pregnant or trying to get pregnant -breast-feeding How should I use this medicine? Take this medicine by mouth with a glass of water. Follow the directions on the prescription label. You can take this medicine with or without food. Take your medicine at regular intervals. Do not take it more often than directed. Do not stop taking this medicine suddenly except upon the advice of your doctor. Stopping this medicine too quickly may cause serious side effects or your condition may worsen. A special MedGuide will be given to you by the pharmacist with each prescription and refill. Be sure to read this information carefully each time. Talk to your pediatrician regarding the use of this medicine in children. While this drug may be prescribed for children as young as 7 years for selected conditions, precautions do apply. Overdosage: If you think you have taken too much of this medicine contact a poison control center or emergency room at once. NOTE: This medicine is only for you. Do not share this medicine with others. What if I miss a dose? If you miss a dose, skip the missed dose and go back to your  regular dosing schedule. Do not take double or extra doses. What may interact with this medicine? Do not take fluoxetine with any of the following medications: -other medicines containing fluoxetine, like Sarafem or Symbyax -cisapride -linezolid -MAOIs like Carbex, Eldepryl, Marplan, Nardil, and Parnate -methylene blue (injected into a vein) -pimozide -thioridazine This medicine may also interact with the following medications: -alcohol -aspirin and aspirin-like medicines -carbamazepine -certain medicines for depression, anxiety, or psychotic disturbances -certain medicines for migraine headaches like almotriptan, eletriptan, frovatriptan, naratriptan, rizatriptan, sumatriptan, zolmitriptan -digoxin -diuretics -fentanyl -flecainide -furazolidone -isoniazid -lithium -medicines for sleep -medicines that treat or prevent blood clots like warfarin, enoxaparin, and dalteparin -NSAIDs, medicines for pain and inflammation, like ibuprofen or naproxen -phenytoin -procarbazine -propafenone -rasagiline -ritonavir -supplements like St. John's wort, kava kava, valerian -tramadol -tryptophan -vinblastine This list may not describe all possible interactions. Give your health care provider a list of all the medicines, herbs, non-prescription drugs, or dietary supplements you use. Also tell them if you smoke, drink alcohol, or use illegal drugs. Some items may interact with your medicine. What should I watch for while using this medicine? Tell your doctor if your symptoms do not get better or if they get worse. Visit your doctor or health care professional for regular checks on your progress. Because it may take several weeks to see the full effects of this medicine, it is important to continue your treatment as prescribed by your doctor. Patients and their families should watch out for new or worsening thoughts of suicide or depression. Also watch out for sudden changes in feelings such as  feeling anxious, agitated, panicky, irritable, hostile, aggressive, impulsive, severely restless,   overly excited and hyperactive, or not being able to sleep. If this happens, especially at the beginning of treatment or after a change in dose, call your health care professional. Bonita QuinYou may get drowsy or dizzy. Do not drive, use machinery, or do anything that needs mental alertness until you know how this medicine affects you. Do not stand or sit up quickly, especially if you are an older patient. This reduces the risk of dizzy or fainting spells. Alcohol may interfere with the effect of this medicine. Avoid alcoholic drinks. Your mouth may get dry. Chewing sugarless gum or sucking hard candy, and drinking plenty of water may help. Contact your doctor if the problem does not go away or is severe. This medicine may affect blood sugar levels. If you have diabetes, check with your doctor or health care professional before you change your diet or the dose of your diabetic medicine. What side effects may I notice from receiving this medicine? Side effects that you should report to your doctor or health care professional as soon as possible: -allergic reactions like skin rash, itching or hives, swelling of the face, lips, or tongue -breathing problems -confusion -eye pain, changes in vision -fast or irregular heart rate, palpitations -flu-like fever, chills, cough, muscle or joint aches and pains -seizures -suicidal thoughts or other mood changes -swelling or redness in or around the eye -tremors -trouble sleeping -unusual bleeding or bruising -unusually tired or weak -vomiting Side effects that usually do not require medical attention (report to your doctor or health care professional if they continue or are bothersome): -change in sex drive or performance -diarrhea -dry mouth -flushing -headache -increased or decreased appetite -nausea -sweating This list may not describe all possible side  effects. Call your doctor for medical advice about side effects. You may report side effects to FDA at 1-800-FDA-1088. Where should I keep my medicine? Keep out of the reach of children. Store at room temperature between 15 and 30 degrees C (59 and 86 degrees F). Throw away any unused medicine after the expiration date. NOTE: This sheet is a summary. It may not cover all possible information. If you have questions about this medicine, talk to your doctor, pharmacist, or health care provider.    2016, Elsevier/Gold Standard. (2014-01-13 12:40:07)   Hydroxyzine capsules or tablets What is this medicine? HYDROXYZINE (hye DROX i zeen) is an antihistamine. This medicine is used to treat allergy symptoms. It is also used to treat anxiety and tension. This medicine can be used with other medicines to induce sleep before surgery. This medicine may be used for other purposes; ask your health care provider or pharmacist if you have questions. What should I tell my health care provider before I take this medicine? They need to know if you have any of these conditions: -any chronic illness -difficulty passing urine -glaucoma -heart disease -kidney disease -liver disease -lung disease -an unusual or allergic reaction to hydroxyzine, cetirizine, other medicines, foods, dyes, or preservatives -pregnant or trying to get pregnant -breast-feeding How should I use this medicine? Take this medicine by mouth with a full glass of water. Follow the directions on the prescription label. You may take this medicine with food or on an empty stomach. Take your medicine at regular intervals. Do not take your medicine more often than directed. Talk to your pediatrician regarding the use of this medicine in children. Special care may be needed. While this drug may be prescribed for children as young as 536 years of age for  selected conditions, precautions do apply. Patients over 9 years old may have a stronger reaction  and need a smaller dose. Overdosage: If you think you have taken too much of this medicine contact a poison control center or emergency room at once. NOTE: This medicine is only for you. Do not share this medicine with others. What if I miss a dose? If you miss a dose, take it as soon as you can. If it is almost time for your next dose, take only that dose. Do not take double or extra doses. What may interact with this medicine? -alcohol -barbiturate medicines for sleep or seizures -medicines for colds, allergies -medicines for depression, anxiety, or emotional disturbances -medicines for pain -medicines for sleep -muscle relaxants This list may not describe all possible interactions. Give your health care provider a list of all the medicines, herbs, non-prescription drugs, or dietary supplements you use. Also tell them if you smoke, drink alcohol, or use illegal drugs. Some items may interact with your medicine. What should I watch for while using this medicine? Tell your doctor or health care professional if your symptoms do not improve. You may get drowsy or dizzy. Do not drive, use machinery, or do anything that needs mental alertness until you know how this medicine affects you. Do not stand or sit up quickly, especially if you are an older patient. This reduces the risk of dizzy or fainting spells. Alcohol may interfere with the effect of this medicine. Avoid alcoholic drinks. Your mouth may get dry. Chewing sugarless gum or sucking hard candy, and drinking plenty of water may help. Contact your doctor if the problem does not go away or is severe. This medicine may cause dry eyes and blurred vision. If you wear contact lenses you may feel some discomfort. Lubricating drops may help. See your eye doctor if the problem does not go away or is severe. If you are receiving skin tests for allergies, tell your doctor you are using this medicine. What side effects may I notice from receiving this  medicine? Side effects that you should report to your doctor or health care professional as soon as possible: -fast or irregular heartbeat -difficulty passing urine -seizures -slurred speech or confusion -tremor Side effects that usually do not require medical attention (report to your doctor or health care professional if they continue or are bothersome): -constipation -drowsiness -fatigue -headache -stomach upset This list may not describe all possible side effects. Call your doctor for medical advice about side effects. You may report side effects to FDA at 1-800-FDA-1088. Where should I keep my medicine? Keep out of the reach of children. Store at room temperature between 15 and 30 degrees C (59 and 86 degrees F). Keep container tightly closed. Throw away any unused medicine after the expiration date. NOTE: This sheet is a summary. It may not cover all possible information. If you have questions about this medicine, talk to your doctor, pharmacist, or health care provider.    2016, Elsevier/Gold Standard. (2007-06-04 14:50:59)   IF you received an x-ray today, you will receive an invoice from [facility]. Please contact [facility] at [phone] with questions or concerns regarding your invoice.   IF you received labwork today, you will receive an invoice from [employer]. Please contact [facility] at [phone] with questions or concerns regarding your invoice.   Our billing staff will not be able to assist you with questions regarding bills from these companies.  You will be contacted with the  lab results as soon as they are available. The fastest way to get your results is to activate your My Chart account. Instructions are located on the last page of this paperwork. If you have not heard from us regarding the results in 2 weeks, please contact this office.     

## 2015-10-22 ENCOUNTER — Encounter: Payer: Self-pay | Admitting: Urgent Care

## 2016-01-01 ENCOUNTER — Telehealth: Payer: Self-pay

## 2016-01-01 NOTE — Telephone Encounter (Signed)
Multiple attempts made via phone to contact patient for new patient appointment.  He does not have voicemail.   I have contacted [facility] and they will text patient to call the office.   Laurell Josephsammy K King, RN

## 2016-01-10 ENCOUNTER — Ambulatory Visit: Payer: Self-pay

## 2016-01-10 ENCOUNTER — Encounter: Payer: Self-pay | Admitting: Internal Medicine

## 2016-01-10 ENCOUNTER — Ambulatory Visit (INDEPENDENT_AMBULATORY_CARE_PROVIDER_SITE_OTHER): Payer: Self-pay | Admitting: Internal Medicine

## 2016-01-10 VITALS — BP 132/83 | HR 68 | Temp 98.3°F | Ht 70.0 in | Wt 166.0 lb

## 2016-01-10 DIAGNOSIS — F17210 Nicotine dependence, cigarettes, uncomplicated: Secondary | ICD-10-CM

## 2016-01-10 DIAGNOSIS — B20 Human immunodeficiency virus [HIV] disease: Secondary | ICD-10-CM

## 2016-01-10 DIAGNOSIS — R634 Abnormal weight loss: Secondary | ICD-10-CM

## 2016-01-10 DIAGNOSIS — Z79899 Other long term (current) drug therapy: Secondary | ICD-10-CM

## 2016-01-10 DIAGNOSIS — Z23 Encounter for immunization: Secondary | ICD-10-CM

## 2016-01-10 DIAGNOSIS — F320 Major depressive disorder, single episode, mild: Secondary | ICD-10-CM

## 2016-01-10 DIAGNOSIS — Z113 Encounter for screening for infections with a predominantly sexual mode of transmission: Secondary | ICD-10-CM

## 2016-01-10 LAB — LIPID PANEL
CHOL/HDL RATIO: 3.7 ratio (ref <5.0)
Cholesterol: 188 mg/dL (ref <200)
HDL: 51 mg/dL (ref >40)
LDL Cholesterol: 116 mg/dL — ABNORMAL HIGH (ref <100)
Triglycerides: 106 mg/dL (ref <150)
VLDL: 21 mg/dL (ref <30)

## 2016-01-10 LAB — CBC
HCT: 39.2 % (ref 38.5–50.0)
Hemoglobin: 12.8 g/dL — ABNORMAL LOW (ref 13.2–17.1)
MCH: 26.4 pg — AB (ref 27.0–33.0)
MCHC: 32.7 g/dL (ref 32.0–36.0)
MCV: 80.8 fL (ref 80.0–100.0)
MPV: 10 fL (ref 7.5–12.5)
PLATELETS: 172 10*3/uL (ref 140–400)
RBC: 4.85 MIL/uL (ref 4.20–5.80)
RDW: 15.1 % — AB (ref 11.0–15.0)
WBC: 6 10*3/uL (ref 3.8–10.8)

## 2016-01-10 LAB — COMPREHENSIVE METABOLIC PANEL
ALT: 23 U/L (ref 9–46)
AST: 22 U/L (ref 10–40)
Albumin: 4.5 g/dL (ref 3.6–5.1)
Alkaline Phosphatase: 56 U/L (ref 40–115)
BUN: 15 mg/dL (ref 7–25)
CHLORIDE: 104 mmol/L (ref 98–110)
CO2: 27 mmol/L (ref 20–31)
CREATININE: 0.99 mg/dL (ref 0.60–1.35)
Calcium: 9 mg/dL (ref 8.6–10.3)
GLUCOSE: 66 mg/dL (ref 65–99)
Potassium: 3.4 mmol/L — ABNORMAL LOW (ref 3.5–5.3)
SODIUM: 140 mmol/L (ref 135–146)
TOTAL PROTEIN: 7.1 g/dL (ref 6.1–8.1)
Total Bilirubin: 0.4 mg/dL (ref 0.2–1.2)

## 2016-01-11 ENCOUNTER — Encounter: Payer: Self-pay | Admitting: Internal Medicine

## 2016-01-11 DIAGNOSIS — F32A Depression, unspecified: Secondary | ICD-10-CM | POA: Insufficient documentation

## 2016-01-11 DIAGNOSIS — F17210 Nicotine dependence, cigarettes, uncomplicated: Secondary | ICD-10-CM | POA: Insufficient documentation

## 2016-01-11 DIAGNOSIS — R634 Abnormal weight loss: Secondary | ICD-10-CM | POA: Insufficient documentation

## 2016-01-11 DIAGNOSIS — F329 Major depressive disorder, single episode, unspecified: Secondary | ICD-10-CM | POA: Insufficient documentation

## 2016-01-11 LAB — RPR

## 2016-01-11 LAB — HEPATITIS A ANTIBODY, TOTAL: HEP A TOTAL AB: NONREACTIVE

## 2016-01-11 LAB — T-HELPER CELL (CD4) - (RCID CLINIC ONLY)
CD4 T CELL HELPER: 30 % — AB (ref 33–55)
CD4 T Cell Abs: 580 /uL (ref 400–2700)

## 2016-01-11 LAB — HEPATITIS B SURFACE ANTIGEN: Hepatitis B Surface Ag: NEGATIVE

## 2016-01-11 LAB — HEPATITIS C ANTIBODY: HCV Ab: NEGATIVE

## 2016-01-11 LAB — HEPATITIS B SURFACE ANTIBODY, QUANTITATIVE: Hepatitis B-Post: <5 m[IU]/mL

## 2016-01-11 NOTE — Progress Notes (Signed)
Patient Active Problem List   Diagnosis Date Noted  . HIV disease ([place]) 01/10/2016    Priority: High  . Depression 01/11/2016  . Unintentional weight loss 01/11/2016  . Cigarette smoker 01/11/2016    Patient's Medications  New Prescriptions   No medications on file  Previous Medications   FLUOXETINE (PROZAC) 20 MG TABLET    Take 1 tablet (20 mg total) by mouth daily.   HYDROXYZINE (ATARAX/VISTARIL) 25 MG TABLET    Take 1-2 tablets (25-50 mg total) by mouth at bedtime as needed.  Modified Medications   No medications on file  Discontinued Medications   FLUCONAZOLE (DIFLUCAN) 200 MG TABLET    Take 1 tablet (200 mg total) by mouth once a week.    Subjective: Frank Moore is a 35 year old who is in for his initial HIV intake visit. He recently tested positive after finding out that his long-term male partner was positive. He states that he was HIV negative in November 2016. He has had receptive anal intercourse without condoms with his partner. He is also had oral sex with 3 other partners in the past year. Since learning of his diagnosis he has shared that information with his partner and several other close friends. He has not told any family members. He says that he is not particular close to his family and that they don't really get "the gay thing".  He has been struggling with depression. He started on Prozac in September. He says that he has had a great deal of stress over the past year. A previous partner died of a drug overdose. He also had a very close friend die of an overdose recently. His depression has been exacerbated by his recent HIV diagnosis. He states that his relationship with his current partner has been stressful. He has been drinking alcohol more. He smokes marijuana to relax. He denies ever using any other street drugs. He is a current cigarette smoker. 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  Review of Systems: Review of Systems  Constitutional:  Positive for weight loss. Negative for chills, diaphoresis, fever and malaise/fatigue.       He has lost 10 pounds over the past 6 months. He attributes this to his depression.  HENT: Negative for sore throat.        He has not seen a dentist in many years.  Respiratory: Negative for cough, sputum production and shortness of breath.   Cardiovascular: Negative for chest pain.  Gastrointestinal: Negative for abdominal pain, diarrhea, heartburn, nausea and vomiting.  Genitourinary: Negative for dysuria and frequency.  Musculoskeletal: Negative for joint pain and myalgias.  Skin: Negative for rash.  Neurological: Negative for dizziness and headaches.  Psychiatric/Behavioral: Positive for depression and substance abuse. Negative for suicidal ideas. The patient is not nervous/anxious.     Past Medical History:  Diagnosis Date  . Depression   . HIV infection [facility])     Social History  Substance Use Topics  . Smoking status: Current Some Day Smoker    Packs/day: 0.25    Years: 18.00  . Smokeless tobacco: Never Used  . Alcohol use 4.2 oz/week    7 Shots of liquor per week     Comment: cocktails    No family history on file.  No Known Allergies  Objective:  Vitals:   01/10/16 1545  BP: 132/83  Pulse: 68  Temp: 98.3 F (36.8 C)  TempSrc: Oral  Weight: 166 lb (  75.3 kg)  Height: 5\' 10"  (1.778 m)   Body mass index is 23.82 kg/m.  Physical Exam  Constitutional: He is oriented to person, place, and time. No distress.  HENT:  Mouth/Throat: No oropharyngeal exudate.  Eyes: Conjunctivae are normal.  Cardiovascular: Normal rate and regular rhythm.   No murmur heard. Pulmonary/Chest: Effort normal and breath sounds normal. He has no wheezes. He has no rales.  Abdominal: Soft. He exhibits no mass. There is no tenderness.  Musculoskeletal: Normal range of motion. He exhibits no edema or tenderness.  Neurological: He is alert and oriented to person, place, and time.  Skin: No rash  noted.  Psychiatric: Mood and affect normal.    Lab Results Lab Results  Component Value Date   WBC 6.0 01/10/2016   HGB 12.8 (L) 01/10/2016   HCT 39.2 01/10/2016   MCV 80.8 01/10/2016   PLT 172 01/10/2016    Lab Results  Component Value Date   CREATININE 0.99 01/10/2016   BUN 15 01/10/2016   NA 140 01/10/2016   K 3.4 (L) 01/10/2016   CL 104 01/10/2016   CO2 27 01/10/2016    Lab Results  Component Value Date   ALT 23 01/10/2016   AST 22 01/10/2016   ALKPHOS 56 01/10/2016   BILITOT 0.4 01/10/2016    Lab Results  Component Value Date   CHOL 188 01/10/2016   HDL 51 01/10/2016   LDLCALC 116 (H) 01/10/2016   TRIG 106 01/10/2016   CHOLHDL 3.7 01/10/2016   No results found for: HIV1RNAQUANT, HIV1RNAVL, CD4TABS   Problem List Items Addressed This Visit      High   HIV disease (HCC)    I will check a full set of HIV labs and urine screen for GC and chlamydia today. He has applied for 14/08/2015 and [employer]. He currently takes is Prozac each morning. He estimates that he has missed about one dose each week. I talked to him about the importance of adherence with antiretroviral medications. I also had him meet with [employer] our ID pharmacist. He will follow-up in 2 weeks to start therapy.      Relevant Orders   Pneumococcal polysaccharide vaccine 23-valent greater than or equal to 2yo subcutaneous/IM (Completed)   MENINGOCOCCAL MCV4O (Completed)   T-helper cell (CD4)- ([facility] clinic only)   HIV 1 RNA quant-no reflex-bld (Completed)   CBC (Completed)   Comprehensive metabolic panel (Completed)   RPR (Completed)   Lipid panel (Completed)   HIV-1 genotypr plus (Completed)   HIV-1 Integrase Genotype (Completed)   HLA B*5701 (Completed)   Urine cytology ancillary only   Quantiferon tb gold assay (blood) (Completed)   Hepatitis A Ab, Total (Completed)   Hepatitis B surface antigen (Completed)   Hepatitis B surface antibody (Completed)   Hepatitis C antibody  (Completed)     Unprioritized   Cigarette smoker    He is an infrequent smoker. I talked to him about cigarette cessation and encouraged him to consider that within the next year.      Depression    I talked to him about our mental health counselors here in clinic and our support groups. He would like to think about both for now. I suggested that he continue his Prozac. He feels like it has helped him some. I encouraged him to cut down on his alcohol intake.      Unintentional weight loss    Other Visit Diagnoses    Need for prophylactic vaccination against  Streptococcus pneumoniae (pneumococcus)    -  Primary   Relevant Orders   Pneumococcal polysaccharide vaccine 23-valent greater than or equal to 2yo subcutaneous/IM (Completed)   Need for prophylactic vaccination and inoculation against meningococcus       Relevant Orders   MENINGOCOCCAL MCV4O (Completed)   Encounter for immunization       Relevant Orders   Flu Vaccine QUAD 36+ mos IM (Completed)        Michel Bickers, MD [facility] [phone] pager   [phone] cell 01/11/2016, 9:59 AM

## 2016-01-11 NOTE — Progress Notes (Signed)
HPI: Frank Moore is a 35 y.o. male who is a newly dx HIV is here for his first visit.   Allergies: No Known Allergies  Vitals:    Past Medical History: Past Medical History:  Diagnosis Date  . Depression   . HIV infection [facility])     Social History: Social History   Social History  . Marital status: Single    Spouse name: N/A  . Number of children: N/A  . Years of education: N/A   Social History Main Topics  . Smoking status: Current Some Day Smoker    Packs/day: 0.25    Years: 18.00  . Smokeless tobacco: Never Used  . Alcohol use 4.2 oz/week    7 Shots of liquor per week     Comment: cocktails  . Drug use:     Types: Marijuana     Comment: occassional, 2x/month  . Sexual activity: Yes    Partners: Male    Birth control/ protection: Condom     Comment: declined    Other Topics Concern  . None   Social History Narrative  . None    Previous Regimen: Naive  Current Regimen: None  Labs: Hepatitis B Surface Ag (no units)  Date Value  01/10/2016 NEGATIVE   HCV Ab (no units)  Date Value  01/10/2016 NEGATIVE    CrCl: Estimated Creatinine Clearance: 106.5 mL/min (by C-G formula based on SCr of 0.99 mg/dL).  Lipids:    Component Value Date/Time   CHOL 188 01/10/2016 1714   TRIG 106 01/10/2016 1714   HDL 51 01/10/2016 1714   CHOLHDL 3.7 01/10/2016 1714   VLDL 21 01/10/2016 1714   LDLCALC 116 (H) 01/10/2016 1714    Assessment: Frank Moore is a newly dx HIV who is here for an initial eval for treatment. I spent around 20 minutes with him to discuss options for therapy. He doesn't eat consistently so that would take out a couple of options. I can't really gauge his adherence during our conversation. We can't start him on anything right now anyway because he doesn't have labs. We are getting all baseline labs today. I, repeatedly, stressed the importance adherence to him. I think we can do DTG/Desc or Triumeq in him once the HLA is  back.  Recommendations:  Either DTG + Descovy or Triumeq based on labs result  Wilfred Lacy, PharmD, BCPS, AAHIVP, CPP Clinical Infectious [facility] 01/11/2016, 10:31 AM

## 2016-01-11 NOTE — Assessment & Plan Note (Signed)
I will check a full set of HIV labs and urine screen for GC and chlamydia today. He has applied for [employer] and [employer]. He currently takes is Prozac each morning. He estimates that he has missed about one dose each week. I talked to him about the importance of adherence with antiretroviral medications. I also had him meet with Uly[facility] SouthwardMinh Pham our ID pharmacist. He will follow-up in 2 weeks to start therapy.

## 2016-01-11 NOTE — Assessment & Plan Note (Signed)
He is an infrequent smoker. I talked to him about cigarette cessation and encouraged him to consider that within the next year.

## 2016-01-11 NOTE — Assessment & Plan Note (Signed)
I talked to him about our mental health counselors here in clinic and our support groups. He would like to think about both for now. I suggested that he continue his Prozac. He feels like it has helped him some. I encouraged him to cut down on his alcohol intake.

## 2016-01-12 LAB — HIV-1 RNA QUANT-NO REFLEX-BLD
HIV 1 RNA QUANT: 821 {copies}/mL — AB (ref <20)
HIV-1 RNA QUANT, LOG: 2.91 {Log_copies}/mL — AB (ref <1.30)

## 2016-01-14 ENCOUNTER — Encounter: Payer: Self-pay | Admitting: Internal Medicine

## 2016-01-14 LAB — QUANTIFERON TB GOLD ASSAY (BLOOD)
INTERFERON GAMMA RELEASE ASSAY: NEGATIVE
Mitogen-Nil: 1.69 IU/mL
QUANTIFERON NIL VALUE: 0.05 [IU]/mL
QUANTIFERON TB AG MINUS NIL: 0 [IU]/mL

## 2016-01-15 LAB — URINE CYTOLOGY ANCILLARY ONLY
Chlamydia: NEGATIVE
Neisseria Gonorrhea: NEGATIVE

## 2016-01-18 ENCOUNTER — Encounter: Payer: Self-pay | Admitting: Internal Medicine

## 2016-01-18 NOTE — Telephone Encounter (Signed)
RN called patient regarding his emailed question. He understands that he does have HIV, that his test was not a false positive. RN discussed results, answered his many questions, confirmed upcoming appointments. He will need connection to THP, counseling, potentially PEER support. Andree CossHowell, Derreon Consalvo M, RN

## 2016-01-21 LAB — HIV-1 GENOTYPR PLUS

## 2016-01-22 LAB — HIV-1 INTEGRASE GENOTYPE: Date Viral Load Collected: 12072017

## 2016-01-24 ENCOUNTER — Encounter: Payer: Self-pay | Admitting: Internal Medicine

## 2016-01-24 ENCOUNTER — Ambulatory Visit: Payer: Self-pay | Admitting: Internal Medicine

## 2016-01-24 ENCOUNTER — Ambulatory Visit (INDEPENDENT_AMBULATORY_CARE_PROVIDER_SITE_OTHER): Payer: Self-pay | Admitting: Internal Medicine

## 2016-01-24 VITALS — BP 143/88 | HR 64 | Temp 98.4°F | Ht 70.0 in | Wt 173.0 lb

## 2016-01-24 DIAGNOSIS — R634 Abnormal weight loss: Secondary | ICD-10-CM

## 2016-01-24 DIAGNOSIS — F320 Major depressive disorder, single episode, mild: Secondary | ICD-10-CM

## 2016-01-24 DIAGNOSIS — Z23 Encounter for immunization: Secondary | ICD-10-CM

## 2016-01-24 DIAGNOSIS — B20 Human immunodeficiency virus [HIV] disease: Secondary | ICD-10-CM

## 2016-01-24 DIAGNOSIS — F17210 Nicotine dependence, cigarettes, uncomplicated: Secondary | ICD-10-CM

## 2016-01-24 LAB — HLA B*5701: HLA-B 5701 W/RFLX HLA-B HIGH: POSITIVE — AB

## 2016-01-24 MED ORDER — DOLUTEGRAVIR SODIUM 50 MG PO TABS: 50.0000 mg | ORAL_TABLET | Freq: Every day | ORAL | 11 refills | Status: DC

## 2016-01-24 MED ORDER — EMTRICITABINE-TENOFOVIR AF 200-25 MG PO TABS: 1.0000 | ORAL_TABLET | Freq: Every day | ORAL | 11 refills | Status: DC

## 2016-01-24 NOTE — Assessment & Plan Note (Signed)
His CD4 count is normal and his HIV viral load is very low for someone not on therapy. His genotype and inner grays resistance assays show wild type virus without any resistance mutations. His HLA [id number] is pending. He again confirms that his meal schedule is erratic so I would prefer to use a regimen he can take on an empty stomach. I will start him on Descovy and Tivicay and get repeat lab work in 6 weeks. I talked to him about the importance of safer sexual practices, limiting the number partners he has in the future, and careful partners collection.

## 2016-01-24 NOTE — Assessment & Plan Note (Signed)
His depression is improving with time, education and Prozac.

## 2016-01-24 NOTE — Assessment & Plan Note (Signed)
I encouraged him to consider cigarette cessation. 

## 2016-01-24 NOTE — Assessment & Plan Note (Signed)
He has gained 7 pounds since his first visit a few weeks ago. I suspect his unintentional weight loss was due to reactive depression

## 2016-01-24 NOTE — Progress Notes (Signed)
HPI: Frank Moore is a 35 y.o. male who is here for his f/u visit to start meds.   Allergies: No Known Allergies  Vitals: Temp: 98.4 F (36.9 C) (12/21 0918) Temp Source: Oral (12/21 0918) BP: 143/88 (12/21 0918) Moore Rate: 64 (12/21 6754)  Past Medical History: Past Medical History:  Diagnosis Date  . Depression   . HIV infection [facility])     Social History: Social History   Social History  . Marital status: Single    Spouse name: N/A  . Number of children: N/A  . Years of education: N/A   Social History Main Topics  . Smoking status: Current Some Day Smoker    Packs/day: 0.25    Years: 18.00  . Smokeless tobacco: Never Used     Comment: 1 a week  . Alcohol use 4.2 oz/week    7 Shots of liquor per week     Comment: cocktails  . Drug use:     Types: Marijuana     Comment: occassional, 2x/month  . Sexual activity: Yes    Partners: Male    Birth control/ protection: Condom     Comment: declined    Other Topics Concern  . Not on file   Social History Narrative  . No narrative on file    Previous Regimen: Naive  Current Regimen: None  Labs: HIV 1 RNA Quant (copies/mL)  Date Value  01/10/2016 821 (H)   CD4 T Cell Abs (/uL)  Date Value  01/10/2016 580   Hepatitis B Surface Ag (no units)  Date Value  01/10/2016 NEGATIVE   HCV Ab (no units)  Date Value  01/10/2016 NEGATIVE    CrCl: Estimated Creatinine Clearance: 106.5 mL/min (by C-G formula based on SCr of 0.99 mg/dL).  Lipids:    Component Value Date/Time   CHOL 188 01/10/2016 1714   TRIG 106 01/10/2016 1714   HDL 51 01/10/2016 1714   CHOLHDL 3.7 01/10/2016 1714   VLDL 21 01/10/2016 1714   LDLCALC 116 (H) 01/10/2016 1714    Assessment: Frank Moore saw [place] about 2 wks ago to do labs so we can start him on meds. He doesn't eat regularly so we came down to either Triumeq vs DTG/Descovy. His HLA is still pending for some reason. Frank Moore is checking on that. Therefore, we are going to do the  Descovy/DTG.   Come to find out he doesn't have insurance so he applied for ADAP a couple of weeks ago. Frank Moore confirmed today that he has been approved. Rx have been sent to [facility] on [address].   Recommendations:  Start Tivicay '50mg'$  PO qday Start Descovy 1 PO qday  Onnie Boer, PharmD, BCPS, AAHIVP, CPP Clinical Infectious [place] for Infectious Disease 01/24/2016, 10:30 AM
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
## 2016-02-05 ENCOUNTER — Encounter: Payer: Self-pay | Admitting: *Deleted

## 2016-02-20 ENCOUNTER — Encounter: Payer: Self-pay | Admitting: Internal Medicine

## 2016-02-21 ENCOUNTER — Ambulatory Visit: Payer: Self-pay

## 2016-02-21 ENCOUNTER — Encounter: Payer: Self-pay | Admitting: Internal Medicine

## 2016-02-21 NOTE — Telephone Encounter (Signed)
RN noted the patient's concern related to his appt date and time. RN contacted the patient and informed him that the clinic will be closed today and appts ([facility] nurse for Hep B #2 and ADAP appt) have been rescheduled for Monday at 2:30 and 3pm Pt confirmed appt date and time.

## 2016-02-25 ENCOUNTER — Ambulatory Visit: Payer: Self-pay

## 2016-02-25 ENCOUNTER — Encounter: Payer: Self-pay | Admitting: Internal Medicine

## 2016-02-25 NOTE — Telephone Encounter (Signed)
Patient needs to reschedule his RW/ADAP certification.  He will get his injection the same day. Andree CossHowell, Shayma Pfefferle M, RN

## 2016-03-10 ENCOUNTER — Ambulatory Visit: Payer: Self-pay

## 2016-03-10 ENCOUNTER — Ambulatory Visit (INDEPENDENT_AMBULATORY_CARE_PROVIDER_SITE_OTHER): Payer: Self-pay

## 2016-03-10 ENCOUNTER — Other Ambulatory Visit: Payer: Self-pay

## 2016-03-10 ENCOUNTER — Other Ambulatory Visit: Payer: Self-pay | Admitting: Pharmacist

## 2016-03-10 DIAGNOSIS — Z23 Encounter for immunization: Secondary | ICD-10-CM

## 2016-03-10 DIAGNOSIS — Z113 Encounter for screening for infections with a predominantly sexual mode of transmission: Secondary | ICD-10-CM

## 2016-03-10 DIAGNOSIS — B20 Human immunodeficiency virus [HIV] disease: Secondary | ICD-10-CM

## 2016-03-10 DIAGNOSIS — Z79899 Other long term (current) drug therapy: Secondary | ICD-10-CM

## 2016-03-10 LAB — CBC
HEMATOCRIT: 42 % (ref 38.5–50.0)
HEMOGLOBIN: 13.9 g/dL (ref 13.2–17.1)
MCH: 27 pg (ref 27.0–33.0)
MCHC: 33.1 g/dL (ref 32.0–36.0)
MCV: 81.7 fL (ref 80.0–100.0)
MPV: 10.4 fL (ref 7.5–12.5)
Platelets: 191 10*3/uL (ref 140–400)
RBC: 5.14 MIL/uL (ref 4.20–5.80)
RDW: 15.3 % — AB (ref 11.0–15.0)
WBC: 6.2 10*3/uL (ref 3.8–10.8)

## 2016-03-11 LAB — COMPREHENSIVE METABOLIC PANEL
ALBUMIN: 4.6 g/dL (ref 3.6–5.1)
ALK PHOS: 59 U/L (ref 40–115)
ALT: 16 U/L (ref 9–46)
AST: 17 U/L (ref 10–40)
BILIRUBIN TOTAL: 0.4 mg/dL (ref 0.2–1.2)
BUN: 18 mg/dL (ref 7–25)
CALCIUM: 9.8 mg/dL (ref 8.6–10.3)
CO2: 27 mmol/L (ref 20–31)
Chloride: 107 mmol/L (ref 98–110)
Creat: 1.06 mg/dL (ref 0.60–1.35)
GLUCOSE: 95 mg/dL (ref 65–99)
POTASSIUM: 4.3 mmol/L (ref 3.5–5.3)
Sodium: 142 mmol/L (ref 135–146)
Total Protein: 7.2 g/dL (ref 6.1–8.1)

## 2016-03-11 LAB — LIPID PANEL
CHOL/HDL RATIO: 4.5 ratio (ref <5.0)
CHOLESTEROL: 205 mg/dL — AB (ref <200)
HDL: 46 mg/dL (ref >40)
LDL Cholesterol: 114 mg/dL — ABNORMAL HIGH (ref <100)
TRIGLYCERIDES: 227 mg/dL — AB (ref <150)
VLDL: 45 mg/dL — ABNORMAL HIGH (ref <30)

## 2016-03-11 LAB — RPR

## 2016-03-12 LAB — T-HELPER CELL (CD4) - (RCID CLINIC ONLY)
CD4 T CELL ABS: 520 /uL (ref 400–2700)
CD4 T CELL HELPER: 28 % — AB (ref 33–55)

## 2016-03-12 LAB — HIV-1 RNA QUANT-NO REFLEX-BLD
HIV 1 RNA QUANT: NOT DETECTED {copies}/mL
HIV-1 RNA Quant, Log: <1.3 Log copies/mL

## 2016-03-14 ENCOUNTER — Encounter: Payer: Self-pay | Admitting: Internal Medicine

## 2016-04-22 ENCOUNTER — Other Ambulatory Visit: Payer: Self-pay

## 2016-05-06 ENCOUNTER — Ambulatory Visit (INDEPENDENT_AMBULATORY_CARE_PROVIDER_SITE_OTHER): Payer: Self-pay | Admitting: Internal Medicine

## 2016-05-06 ENCOUNTER — Encounter: Payer: Self-pay | Admitting: Internal Medicine

## 2016-05-06 DIAGNOSIS — A6001 Herpesviral infection of penis: Secondary | ICD-10-CM

## 2016-05-06 DIAGNOSIS — B20 Human immunodeficiency virus [HIV] disease: Secondary | ICD-10-CM

## 2016-05-06 DIAGNOSIS — K089 Disorder of teeth and supporting structures, unspecified: Secondary | ICD-10-CM

## 2016-05-06 DIAGNOSIS — A6000 Herpesviral infection of urogenital system, unspecified: Secondary | ICD-10-CM | POA: Insufficient documentation

## 2016-05-06 DIAGNOSIS — F32A Depression, unspecified: Secondary | ICD-10-CM

## 2016-05-06 DIAGNOSIS — F329 Major depressive disorder, single episode, unspecified: Secondary | ICD-10-CM

## 2016-05-06 MED ORDER — FLUOXETINE HCL 20 MG PO TABS: 20.0000 mg | ORAL_TABLET | Freq: Every day | ORAL | 11 refills | Status: DC

## 2016-05-06 MED ORDER — VALACYCLOVIR HCL 500 MG PO TABS: 500.0000 mg | ORAL_TABLET | Freq: Two times a day (BID) | ORAL | 11 refills | Status: DC

## 2016-05-06 NOTE — Progress Notes (Signed)
Patient Active Problem List   Diagnosis Date Noted  . HIV disease (HCC) 01/10/2016    Priority: High  . Genital herpes 05/06/2016  . Poor dentition 05/06/2016  . Depression 01/11/2016  . Unintentional weight loss 01/11/2016  . Cigarette smoker 01/11/2016    Patient's Medications  New Prescriptions   VALACYCLOVIR (VALTREX) 500 MG TABLET    Take 1 tablet (500 mg total) by mouth 2 (two) times daily.  Previous Medications   DOLUTEGRAVIR (TIVICAY) 50 MG TABLET    Take 1 tablet (50 mg total) by mouth daily.   EMTRICITABINE-TENOFOVIR AF (DESCOVY) 200-25 MG TABLET    Take 1 tablet by mouth daily.   HYDROXYZINE (ATARAX/VISTARIL) 25 MG TABLET    Take 1-2 tablets (25-50 mg total) by mouth at bedtime as needed.   IBUPROFEN (ADVIL,MOTRIN) 200 MG TABLET    Take 400 mg by mouth every 6 (six) hours as needed.  Modified Medications   Modified Medication Previous Medication   FLUOXETINE (PROZAC) 20 MG TABLET FLUoxetine (PROZAC) 20 MG tablet      Take 1 tablet (20 mg total) by mouth daily.    Take 1 tablet (20 mg total) by mouth daily.  Discontinued Medications   No medications on file    Subjective: Frank Moore is in for his routine HIV follow-up visit. He has not had any problems obtaining, taking or tolerating his Descovy and Tivicay. He does not recall missing doses. He has been having more problems with "stress", anxiety and depression. He stopped taking his Prozac several months ago because he read that it could cause erectile dysfunction although he has not had that problem. He also states that he does not like taking lots of medications. He has not gone back to see his counselor. His partner is HIV positive and goes to [facility]. He is on therapy but his viral load is not suppressed. That has caused his partner to be very anxious and he feels like his partner may even be a little jealous that he is doing well. He has been thinking about moving out into  his own apartment. He says that he has been drinking more alcohol to deal with his stress. He says he is not smoking as much marijuana. He denies using any other illicit drugs. He is also stressed out because he recently had another outbreak of genital herpes. 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  Review of Systems: Review of Systems  Constitutional: Positive for malaise/fatigue. Negative for chills, diaphoresis, fever and weight loss.  HENT: Negative for sore throat.   Respiratory: Negative for cough, sputum production and shortness of breath.   Cardiovascular: Negative for chest pain.  Gastrointestinal: Negative for abdominal pain, diarrhea, heartburn, nausea and vomiting.  Genitourinary: Negative for dysuria and frequency.  Musculoskeletal: Negative for joint pain and myalgias.  Skin: Negative for rash.  Neurological: Negative for dizziness and headaches.  Psychiatric/Behavioral: Positive for depression and substance abuse. The patient is nervous/anxious.     Past Medical History:  Diagnosis Date  . Depression   . HIV infection [facility])     Social History  Substance Use Topics  . Smoking status: Current Some Day Smoker    Packs/day: 0.25    Years: 18.00  . Smokeless tobacco: Never Used     Comment: 1 a week  . Alcohol use 4.2 oz/week    7 Shots of liquor per week  Comment: cocktails    No family history on file.  No Known Allergies  Objective:  Vitals:   05/06/16 1345  BP: 126/81  Pulse: 65  Temp: 98.5 F (36.9 C)  TempSrc: Oral  Weight: 180 lb (81.6 kg)  Height:  (1.803 m)   Body mass index is 25.1 kg/m.  Physical Exam  Constitutional: He is oriented to person, place, and time.  He does appear somewhat anxious and under stress.  HENT:  Mouth/Throat: No oropharyngeal exudate.  Eyes: Conjunctivae are normal.  Cardiovascular: Normal rate and regular rhythm.   No murmur heard. Pulmonary/Chest: Effort normal and breath sounds  normal.  Abdominal: Soft. He exhibits no mass. There is no tenderness.  Genitourinary:  Genitourinary Comments: I can see several spots on the underside of the shaft of his penis where he had his recent herpes outbreak. There are no active lesions at this time. He has very few, small warts around his foreskin.  Musculoskeletal: Normal range of motion.  Neurological: He is alert and oriented to person, place, and time.  Skin: No rash noted.  Psychiatric: Mood normal.  Poor eye contact.    Lab Results Lab Results  Component Value Date   WBC 6.2 03/10/2016   HGB 13.9 03/10/2016   HCT 42.0 03/10/2016   MCV 81.7 03/10/2016   PLT 191 03/10/2016    Lab Results  Component Value Date   CREATININE 1.06 03/10/2016   BUN 18 03/10/2016   NA 142 03/10/2016   K 4.3 03/10/2016   CL 107 03/10/2016   CO2 27 03/10/2016    Lab Results  Component Value Date   ALT 16 03/10/2016   AST 17 03/10/2016   ALKPHOS 59 03/10/2016   BILITOT 0.4 03/10/2016    Lab Results  Component Value Date   CHOL 205 (H) 03/10/2016   HDL 46 03/10/2016   LDLCALC 114 (H) 03/10/2016   TRIG 227 (H) 03/10/2016   CHOLHDL 4.5 03/10/2016   HIV 1 RNA Quant (copies/mL)  Date Value  03/10/2016 <20 NOT DETECTED  01/10/2016 821 (H)   CD4 T Cell Abs (/uL)  Date Value  03/10/2016 520  01/10/2016 580     Problem List Items Addressed This Visit      High   HIV disease (HCC)    His adherence is good and his infection has come under excellent control. I will have him follow-up after lab work in 3 months.      Relevant Medications   valACYclovir (VALTREX) 500 MG tablet     Unprioritized   Depression    I asked him to consider restarting his Prozac and taking it every day. I also had him meet with our counselor, Bernette Redbird today.      Relevant Medications   FLUoxetine (PROZAC) 20 MG tablet   Genital herpes    I have given him a prescription for Valacyclovir to take at the first sign of his next outbreak.       Relevant Medications   valACYclovir (VALTREX) 500 MG tablet   Other Relevant Orders   T-helper cell (CD4)- ([facility] clinic only)   HIV 1 RNA quant-no reflex-bld   RPR   Poor dentition    We were able to get him an urgent appointment in the dental clinic today.           Cliffton Asters, MD [facility] for Infectious Disease [facility] [phone] pager   [phone] cell 05/06/2016, 5:00 PM

## 2016-05-06 NOTE — Assessment & Plan Note (Signed)
I have given him a prescription for Valacyclovir to take at the first sign of his next outbreak.

## 2016-05-06 NOTE — Assessment & Plan Note (Signed)
His adherence is good and his infection has come under excellent control. I will have him follow-up after lab work in 3 months.

## 2016-05-06 NOTE — Assessment & Plan Note (Signed)
We were able to get him an urgent appointment in the dental clinic today.

## 2016-05-06 NOTE — Assessment & Plan Note (Signed)
I asked him to consider restarting his Prozac and taking it every day. I also had him meet with our counselor, Bernette Redbird today.

## 2016-08-05 ENCOUNTER — Other Ambulatory Visit: Payer: Self-pay

## 2016-08-05 DIAGNOSIS — A6001 Herpesviral infection of penis: Secondary | ICD-10-CM

## 2016-08-07 LAB — T-HELPER CELL (CD4) - (RCID CLINIC ONLY)
CD4 T CELL ABS: 560 /uL (ref 400–2700)
CD4 T CELL HELPER: 33 % (ref 33–55)

## 2016-08-08 LAB — SYPHILIS: RPR W/REFLEX TO RPR TITER AND TREPONEMAL ANTIBODIES, TRADITIONAL SCREENING AND DIAGNOSIS ALGORITHM

## 2016-08-08 LAB — HIV-1 RNA QUANT-NO REFLEX-BLD
HIV 1 RNA Quant: <20 {copies}/mL
HIV-1 RNA Quant, Log: <1.3 {Log_copies}/mL

## 2016-08-19 ENCOUNTER — Ambulatory Visit (INDEPENDENT_AMBULATORY_CARE_PROVIDER_SITE_OTHER): Payer: Self-pay | Admitting: Licensed Clinical Social Worker

## 2016-08-19 ENCOUNTER — Ambulatory Visit (INDEPENDENT_AMBULATORY_CARE_PROVIDER_SITE_OTHER): Payer: Self-pay | Admitting: Internal Medicine

## 2016-08-19 ENCOUNTER — Encounter: Payer: Self-pay | Admitting: Internal Medicine

## 2016-08-19 ENCOUNTER — Ambulatory Visit: Payer: Self-pay

## 2016-08-19 VITALS — BP 109/72 | HR 64 | Temp 98.5°F | Ht 71.0 in | Wt 174.0 lb

## 2016-08-19 DIAGNOSIS — K089 Disorder of teeth and supporting structures, unspecified: Secondary | ICD-10-CM

## 2016-08-19 DIAGNOSIS — F1099 Alcohol use, unspecified with unspecified alcohol-induced disorder: Secondary | ICD-10-CM

## 2016-08-19 DIAGNOSIS — B360 Pityriasis versicolor: Secondary | ICD-10-CM

## 2016-08-19 DIAGNOSIS — F331 Major depressive disorder, recurrent, moderate: Secondary | ICD-10-CM

## 2016-08-19 DIAGNOSIS — B20 Human immunodeficiency virus [HIV] disease: Secondary | ICD-10-CM

## 2016-08-19 DIAGNOSIS — Z23 Encounter for immunization: Secondary | ICD-10-CM

## 2016-08-19 MED ORDER — AMOXICILLIN 500 MG PO CAPS: 500.0000 mg | ORAL_CAPSULE | Freq: Three times a day (TID) | ORAL | 0 refills | Status: DC

## 2016-08-19 MED ORDER — FLUCONAZOLE 100 MG PO TABS: 300.0000 mg | ORAL_TABLET | ORAL | 1 refills | Status: AC

## 2016-08-19 NOTE — Addendum Note (Signed)
Addended by: Andree CossHOWELL, Kinza Gouveia M on: 08/19/2016 02:46 PM   Modules accepted: Orders

## 2016-08-19 NOTE — Assessment & Plan Note (Signed)
His infection has come under excellent control. He is told me that his partner is HIV infected and his viral load is detectable. I talked to him about the importance of condoms to protect himself from becoming infected with multidrug resistant virus. He will follow-up after lab work in 6 months.

## 2016-08-19 NOTE — Addendum Note (Signed)
Addended by: Andree CossHOWELL, Malkie Wille M on: 08/19/2016 02:35 PM   Modules accepted: Orders

## 2016-08-19 NOTE — Assessment & Plan Note (Signed)
I will treat him with oral fluconazole. 
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
## 2016-08-19 NOTE — Assessment & Plan Note (Signed)
He is having dental pain. He was put on amoxicillin by the dental clinic but lost it. I will refill that and he is supposed to be seen in the dental clinic next week.

## 2016-08-19 NOTE — Assessment & Plan Note (Signed)
He is still self-medicating without: Marijuana for his stress, anxiety and depression. I will introduce him to our counselor, Vergia AlbertsSherry Royster.
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
## 2016-08-26 ENCOUNTER — Encounter: Payer: Self-pay | Admitting: Internal Medicine

## 2017-01-07 ENCOUNTER — Other Ambulatory Visit: Payer: Self-pay | Admitting: Internal Medicine

## 2017-01-07 DIAGNOSIS — B20 Human immunodeficiency virus [HIV] disease: Secondary | ICD-10-CM

## 2017-02-05 ENCOUNTER — Other Ambulatory Visit: Payer: Self-pay

## 2017-02-05 DIAGNOSIS — B20 Human immunodeficiency virus [HIV] disease: Secondary | ICD-10-CM

## 2017-02-06 LAB — T-HELPER CELL (CD4) - (RCID CLINIC ONLY)
CD4 % Helper T Cell: 37 % (ref 33–55)
CD4 T Cell Abs: 670 /uL (ref 400–2700)

## 2017-02-09 LAB — HIV-1 RNA QUANT-NO REFLEX-BLD
HIV 1 RNA QUANT: DETECTED {copies}/mL — AB
HIV-1 RNA QUANT, LOG: DETECTED {Log_copies}/mL — AB

## 2017-02-19 ENCOUNTER — Ambulatory Visit: Payer: Self-pay

## 2017-02-19 ENCOUNTER — Ambulatory Visit (INDEPENDENT_AMBULATORY_CARE_PROVIDER_SITE_OTHER): Payer: Self-pay | Admitting: Internal Medicine

## 2017-02-19 ENCOUNTER — Encounter: Payer: Self-pay | Admitting: Internal Medicine

## 2017-02-19 DIAGNOSIS — B20 Human immunodeficiency virus [HIV] disease: Secondary | ICD-10-CM

## 2017-02-19 NOTE — Assessment & Plan Note (Signed)
His infection is under excellent, long-term control.  He would like to return after blood work in 6 months.

## 2017-02-19 NOTE — Progress Notes (Signed)
Patient Active Problem List   Diagnosis Date Noted  . HIV disease (HCC) 01/10/2016    Priority: High  . Tinea versicolor 08/19/2016  . Genital herpes 05/06/2016  . Poor dentition 05/06/2016  . Depression 01/11/2016  . Unintentional weight loss 01/11/2016  . Cigarette smoker 01/11/2016    Patient's Medications  New Prescriptions   No medications on file  Previous Medications   AMOXICILLIN (AMOXIL) 500 MG CAPSULE    Take 1 capsule (500 mg total) by mouth 3 (three) times daily.   DESCOVY 200-25 MG TABLET    TAKE 1 TABLET BY MOUTH DAILY.   FLUOXETINE (PROZAC) 20 MG TABLET    Take 1 tablet (20 mg total) by mouth daily.   HYDROXYZINE (ATARAX/VISTARIL) 25 MG TABLET    Take 1-2 tablets (25-50 mg total) by mouth at bedtime as needed.   IBUPROFEN (ADVIL,MOTRIN) 200 MG TABLET    Take 400 mg by mouth every 6 (six) hours as needed.   TIVICAY 50 MG TABLET    TAKE 1 TABLET BY MOUTH DAILY   VALACYCLOVIR (VALTREX) 500 MG TABLET    Take 1 tablet (500 mg total) by mouth 2 (two) times daily.  Modified Medications   No medications on file  Discontinued Medications   No medications on file    Subjective: Frank Moore is in for his routine HIV follow-up visit.  He is not having any problems obtaining, taking or tolerating his Descovy or Tivicay.  He takes them each morning and does not recall missing doses.  He is getting along with his roommate much better.  He felt down at the end of December when it was the first anniversary of his grandfather's death but he is feeling much better now.  He has been able to spend more time with his grandmother.  Review of Systems: Review of Systems  Constitutional: Negative for chills, diaphoresis, fever, malaise/fatigue and weight loss.  HENT: Negative for sore throat.   Respiratory: Negative for cough, sputum production and shortness of breath.   Cardiovascular: Negative for chest pain.  Gastrointestinal: Negative for abdominal pain, diarrhea, heartburn,  nausea and vomiting.  Genitourinary: Negative for dysuria and frequency.  Musculoskeletal: Negative for joint pain and myalgias.  Skin: Negative for rash.  Neurological: Negative for dizziness and headaches.  Psychiatric/Behavioral: Positive for depression. Negative for substance abuse. The patient is nervous/anxious.     Past Medical History:  Diagnosis Date  . Depression   . HIV infection (HCC)     Social History   Tobacco Use  . Smoking status: Former Smoker    Packs/day: 0.25    Years: 18.00    Pack years: 4.50  . Smokeless tobacco: Never Used  . Tobacco comment: 1 a week  Substance Use Topics  . Alcohol use: Yes    Alcohol/week: 4.2 oz    Types: 7 Shots of liquor per week    Comment: cocktails  . Drug use: No    Comment: occassional, 2x/month    No family history on file.  No Known Allergies  Health Maintenance  Topic Date Due  . TETANUS/TDAP  07/14/1998  . INFLUENZA VACCINE  09/03/2016  . HIV Screening  Completed    Objective:  Vitals:   02/19/17 1402  BP: 135/90  Pulse: 64  Temp: (!) 97.5 F (36.4 C)  TempSrc: Oral  Weight: 190 lb (86.2 kg)   Body mass index is 26.5 kg/m.  Physical Exam  Constitutional: He is  oriented to person, place, and time.  He is somewhat serious and reserved but in no distress.  HENT:  Mouth/Throat: No oropharyngeal exudate.  Eyes: Conjunctivae are normal.  Cardiovascular: Normal rate and regular rhythm.  No murmur heard. Pulmonary/Chest: Effort normal and breath sounds normal.  Abdominal: Soft. He exhibits no mass. There is no tenderness.  Musculoskeletal: Normal range of motion.  Neurological: He is alert and oriented to person, place, and time.  Skin: No rash noted.  Psychiatric: Mood and affect normal.    Lab Results Lab Results  Component Value Date   WBC 6.2 03/10/2016   HGB 13.9 03/10/2016   HCT 42.0 03/10/2016   MCV 81.7 03/10/2016   PLT 191 03/10/2016    Lab Results  Component Value Date    CREATININE 1.06 03/10/2016   BUN 18 03/10/2016   NA 142 03/10/2016   K 4.3 03/10/2016   CL 107 03/10/2016   CO2 27 03/10/2016    Lab Results  Component Value Date   ALT 16 03/10/2016   AST 17 03/10/2016   ALKPHOS 59 03/10/2016   BILITOT 0.4 03/10/2016    Lab Results  Component Value Date   CHOL 205 (H) 03/10/2016   HDL 46 03/10/2016   LDLCALC 114 (H) 03/10/2016   TRIG 227 (H) 03/10/2016   CHOLHDL 4.5 03/10/2016   Lab Results  Component Value Date   LABRPR NON REAC 08/05/2016   HIV 1 RNA Quant (copies/mL)  Date Value  02/05/2017 <20 DETECTED (A)  08/05/2016 <20 NOT DETECTED  03/10/2016 <20 NOT DETECTED   CD4 T Cell Abs (/uL)  Date Value  02/05/2017 670  08/05/2016 560  03/10/2016 520     Problem List Items Addressed This Visit      High   HIV disease (HCC)    His infection is under excellent, long-term control.  He would like to return after blood work in 6 months.      Relevant Orders   T-helper cell (CD4)- ([facility] clinic only)   HIV 1 RNA quant-no reflex-bld        Cliffton Asters, MD [facility] [phone] pager   [phone] cell 02/19/2017, 2:26 PM

## 2017-02-25 ENCOUNTER — Encounter: Payer: Self-pay | Admitting: Internal Medicine

## 2017-04-17 ENCOUNTER — Other Ambulatory Visit: Payer: Self-pay | Admitting: Internal Medicine

## 2017-05-16 ENCOUNTER — Other Ambulatory Visit: Payer: Self-pay | Admitting: Internal Medicine

## 2017-05-16 DIAGNOSIS — B20 Human immunodeficiency virus [HIV] disease: Secondary | ICD-10-CM

## 2017-06-17 ENCOUNTER — Other Ambulatory Visit: Payer: Self-pay | Admitting: Internal Medicine

## 2017-06-17 DIAGNOSIS — F330 Major depressive disorder, recurrent, mild: Secondary | ICD-10-CM

## 2017-08-12 ENCOUNTER — Other Ambulatory Visit: Payer: Self-pay | Admitting: Internal Medicine

## 2017-08-12 DIAGNOSIS — B20 Human immunodeficiency virus [HIV] disease: Secondary | ICD-10-CM

## 2017-08-20 ENCOUNTER — Ambulatory Visit: Payer: Self-pay

## 2017-08-20 ENCOUNTER — Other Ambulatory Visit: Payer: Self-pay

## 2017-08-20 DIAGNOSIS — B20 Human immunodeficiency virus [HIV] disease: Secondary | ICD-10-CM

## 2017-08-21 LAB — T-HELPER CELL (CD4) - (RCID CLINIC ONLY)
CD4 % Helper T Cell: 33 % (ref 33–55)
CD4 T Cell Abs: 510 /uL (ref 400–2700)

## 2017-08-22 LAB — HIV-1 RNA QUANT-NO REFLEX-BLD
HIV 1 RNA QUANT: NOT DETECTED {copies}/mL
HIV-1 RNA Quant, Log: <1.3 Log copies/mL

## 2017-09-01 ENCOUNTER — Encounter: Payer: Self-pay | Admitting: Internal Medicine

## 2017-09-07 ENCOUNTER — Ambulatory Visit: Payer: Self-pay | Admitting: Internal Medicine

## 2017-09-11 ENCOUNTER — Other Ambulatory Visit: Payer: Self-pay | Admitting: Internal Medicine

## 2017-09-11 DIAGNOSIS — B20 Human immunodeficiency virus [HIV] disease: Secondary | ICD-10-CM

## 2017-09-17 ENCOUNTER — Encounter: Payer: Self-pay | Admitting: Internal Medicine

## 2017-09-17 ENCOUNTER — Ambulatory Visit (INDEPENDENT_AMBULATORY_CARE_PROVIDER_SITE_OTHER): Payer: Self-pay | Admitting: Internal Medicine

## 2017-09-17 DIAGNOSIS — A6001 Herpesviral infection of penis: Secondary | ICD-10-CM

## 2017-09-17 DIAGNOSIS — N529 Male erectile dysfunction, unspecified: Secondary | ICD-10-CM | POA: Insufficient documentation

## 2017-09-17 DIAGNOSIS — F331 Major depressive disorder, recurrent, moderate: Secondary | ICD-10-CM

## 2017-09-17 DIAGNOSIS — B20 Human immunodeficiency virus [HIV] disease: Secondary | ICD-10-CM

## 2017-09-17 MED ORDER — SILDENAFIL CITRATE 25 MG PO TABS: 25.0000 mg | ORAL_TABLET | Freq: Every day | ORAL | 0 refills | Status: DC | PRN

## 2017-09-17 MED ORDER — VALACYCLOVIR HCL 500 MG PO TABS: 500.0000 mg | ORAL_TABLET | Freq: Two times a day (BID) | ORAL | 11 refills | Status: DC

## 2017-09-17 NOTE — Progress Notes (Signed)
Patient Active Problem List   Diagnosis Date Noted  . HIV disease (HCC) 01/10/2016    Priority: High  . Erectile dysfunction 09/17/2017  . Tinea versicolor 08/19/2016  . Genital herpes 05/06/2016  . Poor dentition 05/06/2016  . Depression 01/11/2016  . Unintentional weight loss 01/11/2016  . Cigarette smoker 01/11/2016    Patient's Medications  New Prescriptions   SILDENAFIL (VIAGRA) 25 MG TABLET    Take 1 tablet (25 mg total) by mouth daily as needed for erectile dysfunction.  Previous Medications   AMOXICILLIN (AMOXIL) 500 MG CAPSULE    Take 1 capsule (500 mg total) by mouth 3 (three) times daily.   DESCOVY 200-25 MG TABLET    TAKE 1 TABLET BY MOUTH DAILY   FLUOXETINE (PROZAC) 20 MG CAPSULE    TAKE 1 CAPSULE(20 MG) BY MOUTH DAILY   HYDROXYZINE (ATARAX/VISTARIL) 25 MG TABLET    Take 1-2 tablets (25-50 mg total) by mouth at bedtime as needed.   IBUPROFEN (ADVIL,MOTRIN) 200 MG TABLET    Take 400 mg by mouth every 6 (six) hours as needed.   TIVICAY 50 MG TABLET    TAKE 1 TABLET BY MOUTH DAILY  Modified Medications   Modified Medication Previous Medication   VALACYCLOVIR (VALTREX) 500 MG TABLET valACYclovir (VALTREX) 500 MG tablet      Take 1 tablet (500 mg total) by mouth 2 (two) times daily.    Take 1 tablet (500 mg total) by mouth 2 (two) times daily.  Discontinued Medications   No medications on file    Subjective: Frank Moore is in for his routine HIV follow-up visit he has had no problems obtaining, taking or tolerating his Descovy and Tivicay and rhythm is ever missing a dose.  He feels that his depression is under better control.  He continues to take his Prozac.  He broke up with his previous boyfriend and has a new partner now.  He says he is very happy.  His partner is HIV negative.  Frank Moore is aware of PrEP but says he has not discussed this with his partner because he is undetectable.  He is bothered by problems maintaining an erection.  He was seen last week in the  dental clinic and hopes to go back to complete work as soon as they call him.  Review of Systems: Review of Systems  Constitutional: Negative for chills, diaphoresis, fever, malaise/fatigue and weight loss.  HENT: Negative for sore throat.   Respiratory: Negative for cough, sputum production and shortness of breath.   Cardiovascular: Negative for chest pain.  Gastrointestinal: Negative for abdominal pain, diarrhea, heartburn, nausea and vomiting.  Genitourinary: Negative for dysuria and frequency.       Erectile dysfunction as noted in HPI.  Musculoskeletal: Negative for joint pain and myalgias.  Skin: Negative for rash.  Neurological: Negative for dizziness and headaches.  Psychiatric/Behavioral: Positive for depression. Negative for substance abuse. The patient is not nervous/anxious.     Past Medical History:  Diagnosis Date  . Depression   . HIV infection (HCC)     Social History   Tobacco Use  . Smoking status: Current Every Day Smoker    Packs/day: 0.25    Years: 18.00    Pack years: 4.50    Types: Cigarettes  . Smokeless tobacco: Never Used  . Tobacco comment: 1 a week  Substance Use Topics  . Alcohol use: Yes    Alcohol/week: 7.0 standard drinks  Types: 7 Shots of liquor per week    Comment: cocktails  . Drug use: No    Types: Marijuana    Comment: occassional, 2x/month    No family history on file.  No Known Allergies  Health Maintenance  Topic Date Due  . TETANUS/TDAP  07/14/1998  . INFLUENZA VACCINE  09/03/2017  . HIV Screening  Completed    Objective:  Vitals:   09/17/17 1550  BP: 125/80  Pulse: 79  Temp: 98.3 F (36.8 C)  Weight: 169 lb 12.8 oz (77 kg)   Body mass index is 23.68 kg/m.  Physical Exam  Constitutional: He is oriented to person, place, and time.  HENT:  Mouth/Throat: No oropharyngeal exudate.  Eyes: Conjunctivae are normal.  Cardiovascular: Normal rate, regular rhythm and normal heart sounds.  No murmur  heard. Pulmonary/Chest: Effort normal and breath sounds normal.  Abdominal: Soft. He exhibits no mass. There is no tenderness.  Musculoskeletal: Normal range of motion.  Neurological: He is alert and oriented to person, place, and time.  Skin: No rash noted.  He has chronic, hyperpigmented areas on his upper back but no evidence of any active tinea infection.  Psychiatric:  He has a flat affect.    Lab Results Lab Results  Component Value Date   WBC 6.2 03/10/2016   HGB 13.9 03/10/2016   HCT 42.0 03/10/2016   MCV 81.7 03/10/2016   PLT 191 03/10/2016    Lab Results  Component Value Date   CREATININE 1.06 03/10/2016   BUN 18 03/10/2016   NA 142 03/10/2016   K 4.3 03/10/2016   CL 107 03/10/2016   CO2 27 03/10/2016    Lab Results  Component Value Date   ALT 16 03/10/2016   AST 17 03/10/2016   ALKPHOS 59 03/10/2016   BILITOT 0.4 03/10/2016    Lab Results  Component Value Date   CHOL 205 (H) 03/10/2016   HDL 46 03/10/2016   LDLCALC 114 (H) 03/10/2016   TRIG 227 (H) 03/10/2016   CHOLHDL 4.5 03/10/2016   Lab Results  Component Value Date   LABRPR NON REAC 08/05/2016   HIV 1 RNA Quant (copies/mL)  Date Value  08/20/2017 <20 NOT DETECTED  02/05/2017 <20 DETECTED (A)  08/05/2016 <20 NOT DETECTED   CD4 T Cell Abs (/uL)  Date Value  08/20/2017 510  02/05/2017 670  08/05/2016 560     Problem List Items Addressed This Visit      High   HIV disease (HCC)    His infection is under excellent, long-term control.  He will continue his current antiretroviral regimen and follow-up after lab work in 6 months I encouraged him to discuss PrEP with his partner.      Relevant Medications   valACYclovir (VALTREX) 500 MG tablet     Unprioritized   Depression    He feels that Prozac is helping with his depression.  He will continue it.      Erectile dysfunction    We will give him a trial of sildenafil      Relevant Medications   sildenafil (VIAGRA) 25 MG tablet    Other Relevant Orders   T-helper cell (CD4)- ([facility] clinic only)   HIV 1 RNA quant-no reflex-bld   CBC   Comprehensive metabolic panel   Lipid panel   RPR   Genital herpes   Relevant Medications   valACYclovir (VALTREX) 500 MG tablet        Cliffton AstersJohn Jessey Stehlin, MD [facility] [phone] pager   [phone] cell 09/17/2017, 4:21 PM

## 2017-09-17 NOTE — Assessment & Plan Note (Signed)
We will give him a trial of sildenafil

## 2017-09-17 NOTE — Assessment & Plan Note (Signed)
He feels that Prozac is helping with his depression.  He will continue it.

## 2017-09-17 NOTE — Assessment & Plan Note (Signed)
His infection is under excellent, long-term control.  He will continue his current antiretroviral regimen and follow-up after lab work in 6 months I encouraged him to discuss PrEP with his partner.

## 2017-10-08 ENCOUNTER — Other Ambulatory Visit: Payer: Self-pay | Admitting: Internal Medicine

## 2017-10-08 DIAGNOSIS — B20 Human immunodeficiency virus [HIV] disease: Secondary | ICD-10-CM

## 2017-11-07 ENCOUNTER — Other Ambulatory Visit: Payer: Self-pay | Admitting: Internal Medicine

## 2017-11-07 DIAGNOSIS — B20 Human immunodeficiency virus [HIV] disease: Secondary | ICD-10-CM

## 2017-11-19 ENCOUNTER — Emergency Department (HOSPITAL_COMMUNITY)
Admission: EM | Admit: 2017-11-19 | Discharge: 2017-11-20 | Disposition: A | Discharge status: Home / Self Care | Payer: Self-pay | Attending: Emergency Medicine | Admitting: Emergency Medicine

## 2017-11-19 ENCOUNTER — Other Ambulatory Visit: Payer: Self-pay

## 2017-11-19 ENCOUNTER — Emergency Department (HOSPITAL_COMMUNITY): Payer: Self-pay

## 2017-11-19 ENCOUNTER — Encounter (HOSPITAL_COMMUNITY): Payer: Self-pay | Admitting: Emergency Medicine

## 2017-11-19 DIAGNOSIS — K2920 Alcoholic gastritis without bleeding: Secondary | ICD-10-CM | POA: Insufficient documentation

## 2017-11-19 DIAGNOSIS — F17210 Nicotine dependence, cigarettes, uncomplicated: Secondary | ICD-10-CM | POA: Insufficient documentation

## 2017-11-19 DIAGNOSIS — B20 Human immunodeficiency virus [HIV] disease: Secondary | ICD-10-CM | POA: Insufficient documentation

## 2017-11-19 DIAGNOSIS — F419 Anxiety disorder, unspecified: Secondary | ICD-10-CM | POA: Insufficient documentation

## 2017-11-19 DIAGNOSIS — Z79899 Other long term (current) drug therapy: Secondary | ICD-10-CM | POA: Insufficient documentation

## 2017-11-19 LAB — BASIC METABOLIC PANEL
Anion gap: 8 (ref 5–15)
BUN: 16 mg/dL (ref 6–20)
CALCIUM: 9.5 mg/dL (ref 8.9–10.3)
CO2: 28 mmol/L (ref 22–32)
CREATININE: 1.2 mg/dL (ref 0.61–1.24)
Chloride: 105 mmol/L (ref 98–111)
GFR calc Af Amer: >60 mL/min (ref >60)
Glucose, Bld: 91 mg/dL (ref 70–99)
Potassium: 3.9 mmol/L (ref 3.5–5.1)
SODIUM: 141 mmol/L (ref 135–145)

## 2017-11-19 LAB — CBC
HCT: 41.1 % (ref 39.0–52.0)
HEMOGLOBIN: 13 g/dL (ref 13.0–17.0)
MCH: 26.7 pg (ref 26.0–34.0)
MCHC: 31.6 g/dL (ref 30.0–36.0)
MCV: 84.4 fL (ref 80.0–100.0)
PLATELETS: 205 10*3/uL (ref 150–400)
RBC: 4.87 MIL/uL (ref 4.22–5.81)
RDW: 13.7 % (ref 11.5–15.5)
WBC: 6.2 10*3/uL (ref 4.0–10.5)
nRBC: 0 % (ref 0.0–0.2)

## 2017-11-19 LAB — I-STAT TROPONIN, ED: TROPONIN I, POC: 0 ng/mL (ref 0.00–0.08)

## 2017-11-19 MED ORDER — HYDROXYZINE HCL 25 MG PO TABS: 25.0000 mg | ORAL_TABLET | Freq: Four times a day (QID) | ORAL | 0 refills | Status: DC

## 2017-11-19 MED ORDER — GI COCKTAIL ~~LOC~~
30.0000 mL | Freq: Once | ORAL | Status: AC
  Administered 2017-11-20: 30 mL via ORAL
  Filled 2017-11-19: qty 30

## 2017-11-19 MED ORDER — HYDROXYZINE HCL 25 MG PO TABS
25.0000 mg | ORAL_TABLET | Freq: Once | ORAL | Status: AC
  Administered 2017-11-20: 25 mg via ORAL
  Filled 2017-11-19: qty 1
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
## 2017-11-20 NOTE — Discharge Instructions (Addendum)
Thank you for allowing me to care for you today in the Emergency Department.   Take 1 tablet of hydroxyzine every 6 hours as needed for worsening anxiety or chest pain.  You can call to schedule a follow-up appointment with [facility].  They have a walk-in clinic as well as a pharmacy.  Protonix is available over-the-counter.  You can try taking 1 tablet daily for 2 weeks to see if this will improve some of your discomfort.  Your work-up today for your heart was very reassuring.  Return to the emergency department if you develop new or worsening symptoms including a high fever, chest pain with exertion, that runs down your left arm, sweating, vomiting, or shortness of breath, or if you develop thoughts where you are concerned you may want to harm yourself or others.

## 2017-12-06 ENCOUNTER — Other Ambulatory Visit: Payer: Self-pay | Admitting: Internal Medicine

## 2017-12-06 DIAGNOSIS — F330 Major depressive disorder, recurrent, mild: Secondary | ICD-10-CM

## 2018-01-06 ENCOUNTER — Telehealth: Payer: Self-pay

## 2018-01-06 NOTE — Telephone Encounter (Signed)
Patient requesting appointment regarding medication. Was prescribed Prozac, but would like to be switched to hydroxyzine.  Attempted to call patient for more information regarding reason for medication change. Unable to reach patient over phone at this time. Sent Myhchart message for more information. Lorenso CourierJose L Della Scrivener, [place]

## 2018-01-08 ENCOUNTER — Other Ambulatory Visit: Payer: Self-pay | Admitting: Internal Medicine

## 2018-01-08 MED ORDER — HYDROXYZINE HCL 25 MG PO TABS: 25.0000 mg | ORAL_TABLET | Freq: Four times a day (QID) | ORAL | 0 refills | Status: DC

## 2018-01-08 NOTE — Telephone Encounter (Signed)
Attempted to call patient to inform him he must [facility]. Unable to reach patient at this time, sent mychart message to patient to relay information.  Lorenso CourierJose L Maldonado, [place]

## 2018-01-08 NOTE — Telephone Encounter (Signed)
I sent in one refill.  Please encourage him to go to [facility] follow[facility] as he was instructed to do when he first got hydroxyzine in the emergency department in October.

## 2018-01-10 ENCOUNTER — Other Ambulatory Visit: Payer: Self-pay | Admitting: Internal Medicine

## 2018-01-10 DIAGNOSIS — F330 Major depressive disorder, recurrent, mild: Secondary | ICD-10-CM

## 2018-03-08 ENCOUNTER — Ambulatory Visit: Payer: Self-pay

## 2018-03-17 ENCOUNTER — Other Ambulatory Visit: Payer: Self-pay

## 2018-03-21 ENCOUNTER — Other Ambulatory Visit: Payer: Self-pay

## 2018-03-24 ENCOUNTER — Encounter: Payer: Self-pay | Admitting: Internal Medicine

## 2018-03-31 ENCOUNTER — Ambulatory Visit: Payer: Self-pay | Admitting: Internal Medicine

## 2018-04-16 ENCOUNTER — Other Ambulatory Visit: Payer: Self-pay | Admitting: Internal Medicine

## 2018-04-16 ENCOUNTER — Other Ambulatory Visit: Payer: Self-pay

## 2018-04-16 DIAGNOSIS — B20 Human immunodeficiency virus [HIV] disease: Secondary | ICD-10-CM

## 2018-04-16 MED ORDER — HYDROXYZINE HCL 25 MG PO TABS: 25.0000 mg | ORAL_TABLET | Freq: Four times a day (QID) | ORAL | 0 refills | Status: DC

## 2018-04-16 MED ORDER — EMTRICITABINE-TENOFOVIR AF 200-25 MG PO TABS: 1.0000 | ORAL_TABLET | Freq: Every day | ORAL | 0 refills | Status: DC

## 2018-04-19 ENCOUNTER — Telehealth: Payer: Self-pay | Admitting: Pharmacy Technician

## 2018-04-19 NOTE — Telephone Encounter (Signed)
[facility] Patient Advocate Encounter   Left voicemail for patient at 13:45 to call back the [facility] pharmacy department. Patient received notification that Tivicay was covered and Descovy was not.  Need to find out his new insurance information and which pharmacy in order to follow up and help him get the Descovy covered.  Will follow-up before the close of today.

## 2018-04-19 NOTE — Telephone Encounter (Signed)
Filled at [facility].  Had the pharmacy change the medication from dose pack Descovy to regular non unit dose packaging.  Will be ready today around 16:00 and the pharmacy sent him a text message to alert that it was ready.

## 2018-06-05 ENCOUNTER — Other Ambulatory Visit: Payer: Self-pay | Admitting: Internal Medicine

## 2018-06-05 DIAGNOSIS — B20 Human immunodeficiency virus [HIV] disease: Secondary | ICD-10-CM

## 2018-07-19 ENCOUNTER — Other Ambulatory Visit: Payer: Self-pay | Admitting: Internal Medicine

## 2018-07-19 DIAGNOSIS — B20 Human immunodeficiency virus [HIV] disease: Secondary | ICD-10-CM

## 2018-08-23 ENCOUNTER — Telehealth: Payer: Self-pay

## 2018-08-23 ENCOUNTER — Other Ambulatory Visit: Payer: Self-pay | Admitting: Internal Medicine

## 2018-08-23 ENCOUNTER — Other Ambulatory Visit: Payer: Self-pay

## 2018-08-23 DIAGNOSIS — B20 Human immunodeficiency virus [HIV] disease: Secondary | ICD-10-CM

## 2018-08-23 MED ORDER — TIVICAY 50 MG PO TABS: 50.0000 mg | ORAL_TABLET | Freq: Every day | ORAL | 0 refills | Status: DC

## 2018-08-23 MED ORDER — DESCOVY 200-25 MG PO TABS: 1.0000 | ORAL_TABLET | Freq: Every day | ORAL | 0 refills | Status: DC

## 2018-08-23 NOTE — Telephone Encounter (Signed)
Attempted to call patient to schedule an appointment with our office. Patient was last seen on 09/17/17. Left voicemail requesting a call back. Frank Moore

## 2018-09-14 ENCOUNTER — Other Ambulatory Visit: Payer: Self-pay

## 2018-09-14 ENCOUNTER — Ambulatory Visit: Payer: Self-pay

## 2018-09-14 DIAGNOSIS — N529 Male erectile dysfunction, unspecified: Secondary | ICD-10-CM

## 2018-09-15 LAB — T-HELPER CELL (CD4) - (RCID CLINIC ONLY)
CD4 % Helper T Cell: 39 % (ref 33–65)
CD4 T Cell Abs: 715 /uL (ref 400–1790)

## 2018-09-17 LAB — COMPREHENSIVE METABOLIC PANEL
AG Ratio: 1.7 (calc) (ref 1.0–2.5)
ALT: 28 U/L (ref 9–46)
AST: 21 U/L (ref 10–40)
Albumin: 4.6 g/dL (ref 3.6–5.1)
Alkaline phosphatase (APISO): 61 U/L (ref 36–130)
BUN: 14 mg/dL (ref 7–25)
CO2: 27 mmol/L (ref 20–32)
Calcium: 9.9 mg/dL (ref 8.6–10.3)
Chloride: 103 mmol/L (ref 98–110)
Creat: 1.21 mg/dL (ref 0.60–1.35)
Globulin: 2.7 g/dL (calc) (ref 1.9–3.7)
Glucose, Bld: 100 mg/dL — ABNORMAL HIGH (ref 65–99)
Potassium: 3.7 mmol/L (ref 3.5–5.3)
Sodium: 139 mmol/L (ref 135–146)
Total Bilirubin: 0.5 mg/dL (ref 0.2–1.2)
Total Protein: 7.3 g/dL (ref 6.1–8.1)

## 2018-09-17 LAB — LIPID PANEL
Cholesterol: 216 mg/dL — ABNORMAL HIGH (ref <200)
HDL: 43 mg/dL (ref >=40)
LDL Cholesterol (Calc): 150 mg/dL (calc) — ABNORMAL HIGH
Non-HDL Cholesterol (Calc): 173 mg/dL (calc) — ABNORMAL HIGH (ref <130)
Total CHOL/HDL Ratio: 5 (calc) — ABNORMAL HIGH (ref <5.0)
Triglycerides: 113 mg/dL (ref <150)

## 2018-09-17 LAB — CBC
HCT: 42.7 % (ref 38.5–50.0)
Hemoglobin: 14 g/dL (ref 13.2–17.1)
MCH: 26.9 pg — ABNORMAL LOW (ref 27.0–33.0)
MCHC: 32.8 g/dL (ref 32.0–36.0)
MCV: 82 fL (ref 80.0–100.0)
MPV: 11.1 fL (ref 7.5–12.5)
Platelets: 176 10*3/uL (ref 140–400)
RBC: 5.21 10*6/uL (ref 4.20–5.80)
RDW: 13.5 % (ref 11.0–15.0)
WBC: 5.3 10*3/uL (ref 3.8–10.8)

## 2018-09-17 LAB — HIV-1 RNA QUANT-NO REFLEX-BLD
HIV 1 RNA Quant: <20 copies/mL
HIV-1 RNA Quant, Log: <1.3 Log copies/mL

## 2018-09-17 LAB — RPR: RPR Ser Ql: NONREACTIVE

## 2018-09-27 ENCOUNTER — Encounter: Payer: Self-pay | Admitting: Infectious Diseases

## 2018-09-28 ENCOUNTER — Telehealth: Payer: Self-pay | Admitting: *Deleted

## 2018-09-28 NOTE — Telephone Encounter (Signed)
Frank Hobby! Looks like we had you scheduled to follow up yesterday (8/24 at 2:30) with one of Dr Hale Bogus partners to go over your lab results.  Did you get to renew your ADAP on the 11th?  How does 9/2 at 2:45 work for you?  This will be with one of his partners, Terri Piedra NP. THank you,  Sharyn Lull ===View-only below this line===   ----- Message -----      From:Frank Moore      Sent:09/28/2018  2:23 PM EDT        QM:GNOI Frank Salon, MD   Subject:Appointment Request  Appointment Request From: Frank Moore  With Provider: Michel Bickers, MD [facility]]  Preferred Date Range: 09/30/2018 - 10/24/2018  Preferred Times: Any Time  Reason for visit: Office Visit  Comments: Just want to get a regular check up

## 2018-10-05 ENCOUNTER — Other Ambulatory Visit: Payer: Self-pay | Admitting: Internal Medicine

## 2018-10-05 DIAGNOSIS — B20 Human immunodeficiency virus [HIV] disease: Secondary | ICD-10-CM

## 2018-10-06 ENCOUNTER — Encounter: Payer: Self-pay | Admitting: Family

## 2018-10-06 ENCOUNTER — Other Ambulatory Visit: Payer: Self-pay

## 2018-10-06 ENCOUNTER — Ambulatory Visit: Payer: Self-pay

## 2018-10-06 ENCOUNTER — Ambulatory Visit (INDEPENDENT_AMBULATORY_CARE_PROVIDER_SITE_OTHER): Payer: Self-pay | Admitting: Family

## 2018-10-06 VITALS — BP 129/84 | HR 71 | Temp 98.6°F

## 2018-10-06 DIAGNOSIS — B20 Human immunodeficiency virus [HIV] disease: Secondary | ICD-10-CM

## 2018-10-06 DIAGNOSIS — Z23 Encounter for immunization: Secondary | ICD-10-CM

## 2018-10-06 DIAGNOSIS — F17210 Nicotine dependence, cigarettes, uncomplicated: Secondary | ICD-10-CM

## 2018-10-06 DIAGNOSIS — Z0000 Encounter for general adult medical examination without abnormal findings: Secondary | ICD-10-CM

## 2018-10-06 NOTE — Progress Notes (Signed)
Subjective:    Patient ID: Frank Moore, male    DOB: 06/10/1980, 38 y.o.   MRN: [id number]  Chief Complaint  Patient presents with  . HIV Positive/AIDS     HPI:  Frank Moore is a 38 y.o. male with HIV disease who was last seen in the office on 09/17/2017 with good adherence and tolerance to his ART regimen of Tivicay and Descovy.  His depression was well controlled with Prozac.  Blood work at the time showed a CD4 count of 510 with a viral load that was undetectable.  Most recent blood work completed on 09/14/2018 with viral load that remains undetectable and CD4 count of 715.  RPR nonreactive for syphilis.  Kidney function, liver function, and electrolytes within normal ranges.  Lipid profile with triglycerides of 113, LDL of 150, and HDL of 43.  Healthcare maintenance due includes second dose of Menveo as well as Prevnar vaccinations.  Frank Moore continues to take his Tivicay and Descovy as prescribed with no adverse side effects or missed doses since his last office visit.  Overall feeling very well with no new concerns/complaints.  He has taken himself off of his Prozac as well as his hydroxyzine as he is been feeling better.  He has a couple of questions regarding possible cure of HIV disease that he read about. Denies fevers, chills, night sweats, headaches, changes in vision, neck pain/stiffness, nausea, diarrhea, vomiting, lesions or rashes.  Frank Moore has no problems obtaining his medication from his pharmacy and remains covered through financial assistance with UMAP/ADAP.  Denies any current feelings of being down, depressed, or hopeless.  He does socially drink as well as smoke tobacco.  No recreational or illicit drug use currently.  He does remain sexually active and uses condoms when he is.  He is considering going back to work in [employer].  Has concerns of developing back pain and need for potential pain medication.  No Known Allergies    Outpatient  Medications Prior to Visit  Medication Sig Dispense Refill  . DESCOVY 200-25 MG tablet TAKE 1 TABLET BY MOUTH DAILY 30 tablet 3  . ibuprofen (ADVIL,MOTRIN) 200 MG tablet Take 400 mg by mouth every 6 (six) hours as needed.    . sildenafil (VIAGRA) 25 MG tablet Take 1 tablet (25 mg total) by mouth daily as needed for erectile dysfunction. 30 tablet 0  . TIVICAY 50 MG tablet TAKE 1 TABLET(50 MG) BY MOUTH DAILY 30 tablet 3  . valACYclovir (VALTREX) 500 MG tablet Take 1 tablet (500 mg total) by mouth 2 (two) times daily. 10 tablet 11  . FLUoxetine (PROZAC) 20 MG capsule TAKE 1 CAPSULE(20 MG) BY MOUTH DAILY (Patient not taking: Reported on 10/06/2018) 30 capsule 3  . hydrOXYzine (ATARAX/VISTARIL) 25 MG tablet Take 1 tablet (25 mg total) by mouth every 6 (six) hours. (Patient not taking: Reported on 10/06/2018) 30 tablet 0   No facility-administered medications prior to visit.      Past Medical History:  Diagnosis Date  . Depression   . HIV infection (HCC)      History reviewed. No pertinent surgical history.     Review of Systems  Constitutional: Negative for appetite change, chills, fatigue, fever and unexpected weight change.  Eyes: Negative for visual disturbance.  Respiratory: Negative for cough, chest tightness, shortness of breath and wheezing.   Cardiovascular: Negative for chest pain and leg swelling.  Gastrointestinal: Negative for abdominal pain, constipation, diarrhea, nausea and vomiting.  Genitourinary:  Negative for dysuria, flank pain, frequency, genital sores, hematuria and urgency.  Skin: Negative for rash.  Allergic/Immunologic: Negative for immunocompromised state.  Neurological: Negative for dizziness and headaches.      Objective:    BP 129/84   Pulse 71   Temp 98.6 F (37 C)  Nursing note and vital signs reviewed.  Physical Exam Constitutional:      General: He is not in acute distress.    Appearance: He is well-developed.  Eyes:     Conjunctiva/sclera:  Conjunctivae normal.  Neck:     Musculoskeletal: Neck supple.  Cardiovascular:     Rate and Rhythm: Normal rate and regular rhythm.     Heart sounds: Normal heart sounds. No murmur. No friction rub. No gallop.   Pulmonary:     Effort: Pulmonary effort is normal. No respiratory distress.     Breath sounds: Normal breath sounds. No wheezing or rales.  Chest:     Chest wall: No tenderness.  Abdominal:     General: Bowel sounds are normal.     Palpations: Abdomen is soft.     Tenderness: There is no abdominal tenderness.  Lymphadenopathy:     Cervical: No cervical adenopathy.  Skin:    General: Skin is warm and dry.     Findings: No rash.  Neurological:     Mental Status: He is alert and oriented to person, place, and time.  Psychiatric:        Behavior: Behavior normal.        Thought Content: Thought content normal.        Judgment: Judgment normal.      Depression screen [facility] 2/9 10/06/2018 09/17/2017 02/19/2017 08/19/2016 05/06/2016  Decreased Interest 0 0 0 0 3  Down, Depressed, Hopeless 0 0 0 0 3  PHQ - 2 Score 0 0 0 0 6  Altered sleeping - - - - 0  Tired, decreased energy - - - - 1  Change in appetite - - - - 0  Feeling bad or failure about yourself  - - - - 2  Trouble concentrating - - - - 2  Moving slowly or fidgety/restless - - - - 1  Suicidal thoughts - - - - 1  PHQ-9 Score - - - - 13  Difficult doing work/chores - - - - Very difficult       Assessment & Plan:    Patient Active Problem List   Diagnosis Date Noted  . HIV disease ([place]) 01/10/2016    Priority: High  . Healthcare maintenance 10/06/2018  . Erectile dysfunction 09/17/2017  . Tinea versicolor 08/19/2016  . Genital herpes 05/06/2016  . Poor dentition 05/06/2016  . Depression 01/11/2016  . Unintentional weight loss 01/11/2016  . Cigarette smoker 01/11/2016   Problem List Items Addressed This Visit      Other   HIV disease ([place]) - Primary (Chronic)    Frank Moore has well-controlled HIV disease  with good adherence and tolerance to his ART regimen of Tivicay and Descovy.  He has no signs/symptoms of opportunistic infection or progressive HIV disease at present.  We discussed that there has yet to be a cure for HIV disease although research continues.  Reviewed blood work with a lot of time for questions.  He has no problems obtaining his medication from the pharmacy and financial assistance is up-to-date.  Continue current dose of Tivicay and Descovy.  Plan for follow-up in 6 months or sooner if needed with lab work 1 to  2 weeks prior to appointment.      Relevant Orders   T-helper cell (CD4)- ([facility] clinic only)   HIV-1 RNA quant-no reflex-bld   Comprehensive metabolic panel   Pneumococcal conjugate vaccine 13-valent IM (Completed)   Cigarette smoker   Healthcare maintenance     Influenza vaccination and Prevnar updated today.  Due for second dose of Menveo at next office visit.  Discussed importance of safe sexual practice to reduce risk of acquisition/transmission of STI.  Condoms provided.       Other Visit Diagnoses    Need for pneumococcal vaccine       Relevant Orders   Pneumococcal conjugate vaccine 13-valent IM (Completed)   Need for immunization against influenza       Relevant Orders   Flu Vaccine QUAD 36+ mos IM (Completed)       I have discontinued Frank Moore FLUoxetine and hydrOXYzine. I am also having him maintain his ibuprofen, valACYclovir, sildenafil, Tivicay, and Descovy.   Follow-up: Return in about 6 months (around 04/05/2019).   Marcos EkeGreg , MSN, FNP-C Nurse Practitioner [facility] Main number: [phone]

## 2018-10-06 NOTE — Patient Instructions (Signed)
Nice to meet you!  Please continue to take your Tivicay and Descovy as prescribed.  Refills have been sent to the pharmacy.  Plan for follow up in 6 months or sooner if needed with lab work 1-2 weeks prior to appointment.   Have a great day and stay safe!

## 2018-10-06 NOTE — Assessment & Plan Note (Signed)
Mr. Balderston appears to have adequately controlled depression at the present time and has self discontinued his fluoxetine and hydroxyzine.  He has no symptoms of depression presently with PHQ 2/9 being 0.  Continue to monitor.

## 2018-10-06 NOTE — Assessment & Plan Note (Signed)
Mr. Frank Moore has well-controlled HIV disease with good adherence and tolerance to his ART regimen of Tivicay and Descovy.  He has no signs/symptoms of opportunistic infection or progressive HIV disease at present.  We discussed that there has yet to be a cure for HIV disease although research continues.  Reviewed blood work with a lot of time for questions.  He has no problems obtaining his medication from the pharmacy and financial assistance is up-to-date.  Continue current dose of Tivicay and Descovy.  Plan for follow-up in 6 months or sooner if needed with lab work 1 to 2 weeks prior to appointment.

## 2018-10-06 NOTE — Assessment & Plan Note (Signed)
   Influenza vaccination and Prevnar updated today.  Due for second dose of Menveo at next office visit.  Discussed importance of safe sexual practice to reduce risk of acquisition/transmission of STI.  Condoms provided.

## 2018-10-07 ENCOUNTER — Encounter: Payer: Self-pay | Admitting: Family

## 2018-12-01 ENCOUNTER — Other Ambulatory Visit: Payer: Self-pay | Admitting: Internal Medicine

## 2018-12-01 DIAGNOSIS — A6001 Herpesviral infection of penis: Secondary | ICD-10-CM

## 2018-12-02 NOTE — Telephone Encounter (Signed)
Patient ADAP application was not received by [facility]. I called patient to make him aware. I didn't a new application and emailed it to [place]. Patient states he thinks he has enough medications to last him until the approval. Thank you

## 2018-12-22 ENCOUNTER — Other Ambulatory Visit: Payer: Self-pay

## 2018-12-22 DIAGNOSIS — A6001 Herpesviral infection of penis: Secondary | ICD-10-CM

## 2018-12-22 MED ORDER — VALACYCLOVIR HCL 500 MG PO TABS: ORAL_TABLET | ORAL | 11 refills | Status: DC

## 2018-12-22 NOTE — Telephone Encounter (Signed)
Patient walked into clinic requesting to speak with a nurse.  He wanted to know if he can add a multi vitamin with his current HIV regimen.  He also wanted to know why his Valtrex cost $ 30.00.  I checked his records and the Valtrex was sent to a [facility] other than [facility].  I explained to patient he must use this specific pharmacy for his HMAP medications and he says he has gotten his medications at the other pharmacy before.   I advised patient we could send Valtrex to [facility] [address] and he can speak with them regarding whatever arrangement was made between the two pharmacies.   Laverle Patter, RN

## 2019-01-15 IMAGING — DX DG CHEST 2V
2 series · 2 of 2 positions shown · non-contrast
Comparison: None.

CLINICAL DATA: Left-sided chest pain

EXAM:
CHEST - 2 VIEW

[w chest pa]
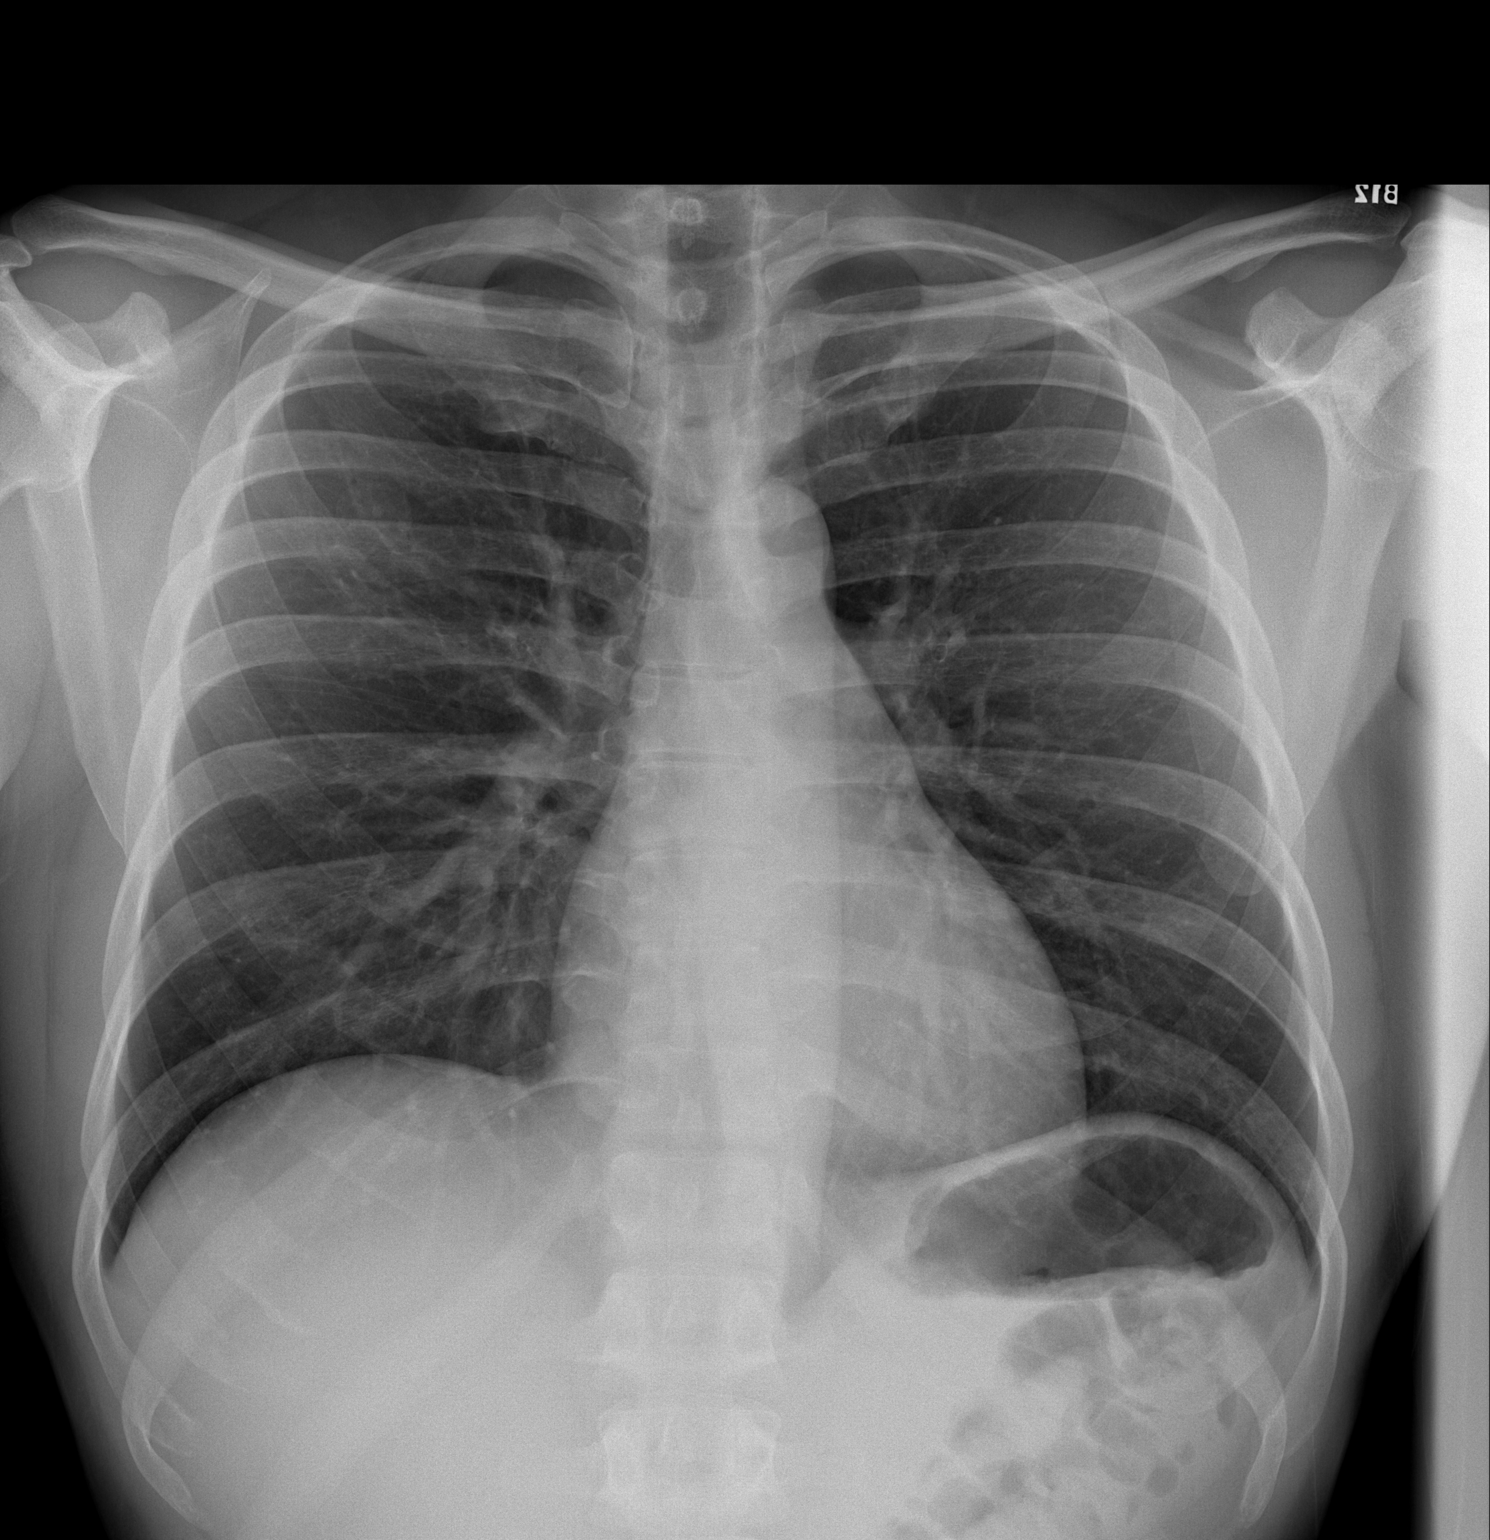

[w chest lat]
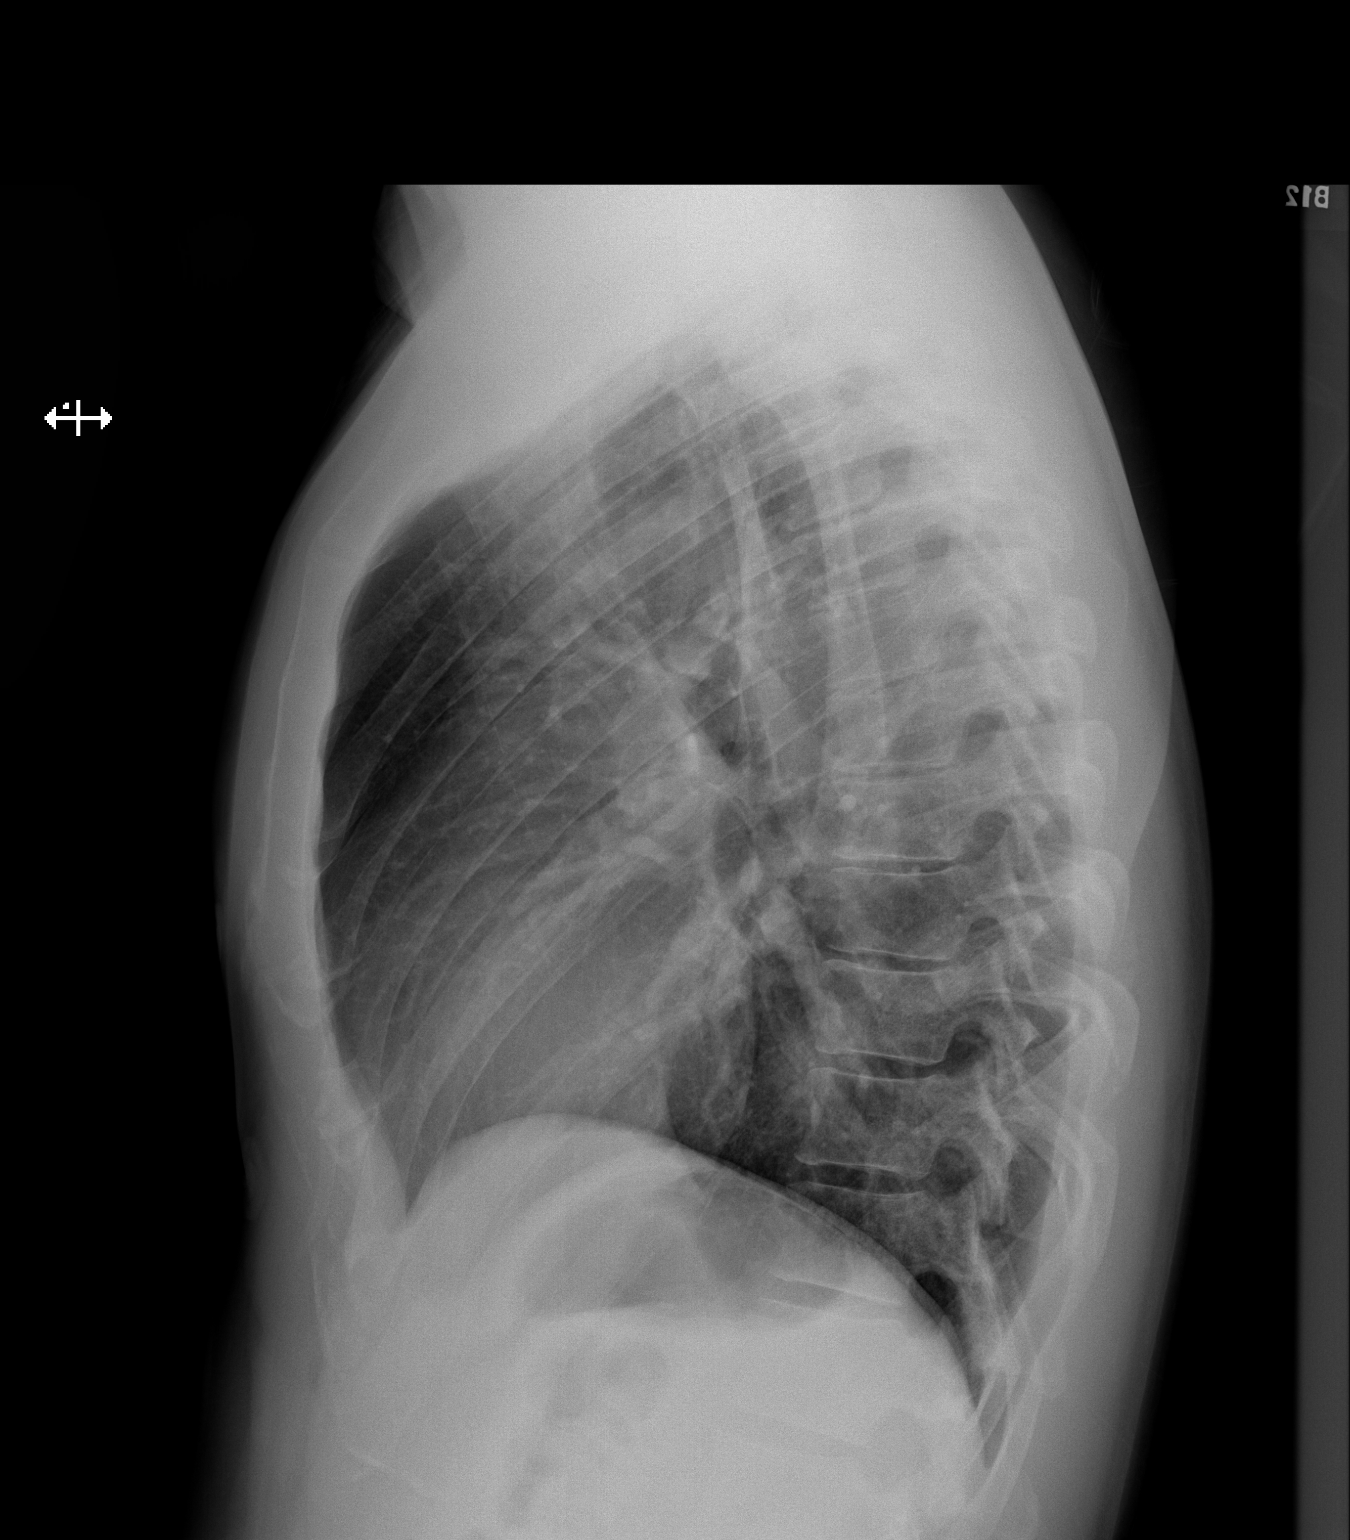

[2 of 2 positions shown; findings below may reference images not displayed]

FINDINGS: The heart size and mediastinal contours are within normal limits.
Both lungs are clear. The visualized skeletal structures are
unremarkable.
IMPRESSION: No active cardiopulmonary disease.

## 2019-02-15 ENCOUNTER — Other Ambulatory Visit: Payer: Self-pay | Admitting: Internal Medicine

## 2019-02-15 DIAGNOSIS — B20 Human immunodeficiency virus [HIV] disease: Secondary | ICD-10-CM

## 2019-03-22 ENCOUNTER — Other Ambulatory Visit: Payer: Self-pay

## 2019-03-22 DIAGNOSIS — B20 Human immunodeficiency virus [HIV] disease: Secondary | ICD-10-CM

## 2019-03-23 LAB — T-HELPER CELL (CD4) - (RCID CLINIC ONLY)
CD4 % Helper T Cell: 43 % (ref 33–65)
CD4 T Cell Abs: 757 /uL (ref 400–1790)

## 2019-03-25 LAB — COMPREHENSIVE METABOLIC PANEL
AG Ratio: 2 (calc) (ref 1.0–2.5)
ALT: 20 U/L (ref 9–46)
AST: 16 U/L (ref 10–40)
Albumin: 4.5 g/dL (ref 3.6–5.1)
Alkaline phosphatase (APISO): 68 U/L (ref 36–130)
BUN: 20 mg/dL (ref 7–25)
CO2: 29 mmol/L (ref 20–32)
Calcium: 9.4 mg/dL (ref 8.6–10.3)
Chloride: 108 mmol/L (ref 98–110)
Creat: 1.06 mg/dL (ref 0.60–1.35)
Globulin: 2.3 g/dL (calc) (ref 1.9–3.7)
Glucose, Bld: 73 mg/dL (ref 65–99)
Potassium: 4 mmol/L (ref 3.5–5.3)
Sodium: 142 mmol/L (ref 135–146)
Total Bilirubin: 0.5 mg/dL (ref 0.2–1.2)
Total Protein: 6.8 g/dL (ref 6.1–8.1)

## 2019-03-25 LAB — HIV-1 RNA QUANT-NO REFLEX-BLD
HIV 1 RNA Quant: <20 copies/mL — AB
HIV-1 RNA Quant, Log: <1.3 Log copies/mL — AB

## 2019-03-31 ENCOUNTER — Encounter: Payer: Self-pay | Admitting: Family

## 2019-04-05 ENCOUNTER — Encounter: Payer: Self-pay | Admitting: Family

## 2019-04-05 ENCOUNTER — Ambulatory Visit (INDEPENDENT_AMBULATORY_CARE_PROVIDER_SITE_OTHER): Payer: Self-pay | Admitting: Family

## 2019-04-05 ENCOUNTER — Other Ambulatory Visit: Payer: Self-pay

## 2019-04-05 VITALS — BP 136/85 | HR 60 | Temp 98.3°F | Ht 71.0 in | Wt 178.0 lb

## 2019-04-05 DIAGNOSIS — B20 Human immunodeficiency virus [HIV] disease: Secondary | ICD-10-CM

## 2019-04-05 DIAGNOSIS — F17210 Nicotine dependence, cigarettes, uncomplicated: Secondary | ICD-10-CM

## 2019-04-05 DIAGNOSIS — Z113 Encounter for screening for infections with a predominantly sexual mode of transmission: Secondary | ICD-10-CM

## 2019-04-05 DIAGNOSIS — F418 Other specified anxiety disorders: Secondary | ICD-10-CM

## 2019-04-05 MED ORDER — TIVICAY 50 MG PO TABS: ORAL_TABLET | ORAL | 5 refills | Status: DC

## 2019-04-05 MED ORDER — BUSPIRONE HCL 7.5 MG PO TABS: 7.5000 mg | ORAL_TABLET | Freq: Two times a day (BID) | ORAL | 2 refills | Status: DC

## 2019-04-05 MED ORDER — DESCOVY 200-25 MG PO TABS: 1.0000 | ORAL_TABLET | Freq: Every day | ORAL | 5 refills | Status: DC

## 2019-04-05 NOTE — Patient Instructions (Signed)
Nice to see you.  Continue take your Tivicay and Descovy as prescribed daily.  Refills of been sent to the pharmacy.  Start taking buspirone 7.5 mg twice daily  Plan for follow-up in 1 month or sooner if needed for anxiety in 6 months or sooner if needed with blood work 1 to 2 weeks prior to her appointment for HIV.  Have a great day and stay safe!

## 2019-04-05 NOTE — Progress Notes (Signed)
Subjective:    Patient ID: Frank Moore, male    DOB: [birth year], 39 y.o.   MRN: [id number]  Chief Complaint  Patient presents with  . Follow-up  . Anxiety     HPI:  Frank Moore is a 39 y.o. male with HIV disease who was last seen in the office on 10/06/18 with good adherence and tolerance to his ART regimen of Tivicay and Descovy. Viral load at the time was undetectable and CD4 count was 715. Most recent blood work completed on 03/22/19 with CD4 count of 757 and viral load that remains undetectable. Healthcare maintenance due include 2nd dose of Menveo and Tetanus.   Mr. Frank Moore continues to take his Tivicay and Descovy as prescribed with no adverse side effects or missed doses. Overall doing well today, but having increasing levels of anxiety related to work with a severity that results in chest pain at times. Not currently having any chest pain or shortness of breath. Denies fevers, chills, night sweats, headaches, changes in vision, neck pain/stiffness, nausea, diarrhea, vomiting, lesions or rashes.  Mr. Frank Moore has no problems obtaining his medication from the pharmacy and remains covered by UMAP/ADAP. He does smoke marijuana on occasion and about 1/4 pack tobacco per day with alcohol on a daily basis. Not currently sexually active. Denies any recent feelings of being down, depressed or hopeless.   No Known Allergies    Outpatient Medications Prior to Visit  Medication Sig Dispense Refill  . valACYclovir (VALTREX) 500 MG tablet TAKE 1 TABLET(500 MG) BY MOUTH TWICE DAILY 10 tablet 11  . DESCOVY 200-25 MG tablet TAKE 1 TABLET BY MOUTH DAILY 30 tablet 2  . TIVICAY 50 MG tablet TAKE 1 TABLET(50 MG) BY MOUTH DAILY 30 tablet 2  . ibuprofen (ADVIL,MOTRIN) 200 MG tablet Take 400 mg by mouth every 6 (six) hours as needed.    . sildenafil (VIAGRA) 25 MG tablet Take 1 tablet (25 mg total) by mouth daily as needed for erectile dysfunction. (Patient not taking: Reported on 04/05/2019) 30 tablet 0     No facility-administered medications prior to visit.     Past Medical History:  Diagnosis Date  . Depression   . HIV infection ([place])      History reviewed. No pertinent surgical history.     Review of Systems  Constitutional: Negative for appetite change, chills, fatigue, fever and unexpected weight change.  Eyes: Negative for visual disturbance.  Respiratory: Negative for cough, chest tightness, shortness of breath and wheezing.   Cardiovascular: Negative for chest pain and leg swelling.  Gastrointestinal: Negative for abdominal pain, constipation, diarrhea, nausea and vomiting.  Genitourinary: Negative for dysuria, flank pain, frequency, genital sores, hematuria and urgency.  Skin: Negative for rash.  Allergic/Immunologic: Negative for immunocompromised state.  Neurological: Negative for dizziness and headaches.  Psychiatric/Behavioral: The patient is nervous/anxious.       Objective:    BP 136/85   Pulse 60   Temp 98.3 F (36.8 C)   Ht 5\' 11"  (1.803 m)   Wt 178 lb (80.7 kg)   SpO2 99%   BMI 24.83 kg/m  Nursing note and vital signs reviewed.  Physical Exam Constitutional:      General: He is not in acute distress.    Appearance: He is well-developed.     Comments: Seated in the chair; pleasant.   Eyes:     Conjunctiva/sclera: Conjunctivae normal.  Cardiovascular:     Rate and Rhythm: Normal rate and regular rhythm.  Heart sounds: Normal heart sounds. No murmur. No friction rub. No gallop.   Pulmonary:     Effort: Pulmonary effort is normal. No respiratory distress.     Breath sounds: Normal breath sounds. No wheezing or rales.  Chest:     Chest wall: No tenderness.  Abdominal:     General: Bowel sounds are normal.     Palpations: Abdomen is soft.     Tenderness: There is no abdominal tenderness.  Musculoskeletal:     Cervical back: Neck supple.  Lymphadenopathy:     Cervical: No cervical adenopathy.  Skin:    General: Skin is warm and dry.      Findings: No rash.  Neurological:     Mental Status: He is alert and oriented to person, place, and time.  Psychiatric:        Behavior: Behavior normal.        Thought Content: Thought content normal.        Judgment: Judgment normal.      Depression screen [facility] 2/9 04/05/2019 10/06/2018 09/17/2017 02/19/2017 08/19/2016  Decreased Interest 1 0 0 0 0  Down, Depressed, Hopeless 1 0 0 0 0  PHQ - 2 Score 2 0 0 0 0  Altered sleeping 2 - - - -  Tired, decreased energy 3 - - - -  Change in appetite 0 - - - -  Feeling bad or failure about yourself  0 - - - -  Trouble concentrating 0 - - - -  Moving slowly or fidgety/restless 0 - - - -  Suicidal thoughts 0 - - - -  PHQ-9 Score 7 - - - -  Difficult doing work/chores Somewhat difficult - - - -       Assessment & Plan:    Patient Active Problem List   Diagnosis Date Noted  . HIV disease (HCC) 01/10/2016    Priority: High  . Situational anxiety 04/06/2019  . Healthcare maintenance 10/06/2018  . Erectile dysfunction 09/17/2017  . Tinea versicolor 08/19/2016  . Genital herpes 05/06/2016  . Poor dentition 05/06/2016  . Depression 01/11/2016  . Unintentional weight loss 01/11/2016  . Cigarette smoker 01/11/2016     Problem List Items Addressed This Visit      Other   HIV disease (HCC) - Primary (Chronic)    Mr. Frank Moore has well controlled HIV disease with good adherence and tolerance to his ART regimen of Tivicay and Descovy. No signs/symptoms of opportunistic infection or progressive HIV disease. We reviewed his lab work and discussed the plan of care. Financial assistance is up to date. Continue current dose of Tivicay and Descovy. Plan for follow up in 3 months or sooner if needed.       Relevant Medications   dolutegravir (TIVICAY) 50 MG tablet   emtricitabine-tenofovir AF (DESCOVY) 200-25 MG tablet   Other Relevant Orders   T-helper cell (CD4)- ([facility] clinic only)   HIV-1 RNA quant-no reflex-bld   Comprehensive metabolic panel    Cigarette smoker    Mr. Frank Moore continues to smoke tobacco at a rate of 1/4 pack per day on average. Discussed importance of tobacco cessation to reduce risk of cardiovascular, respiratory or malignant disease in the future. He is in the pre-contemplation stage and not ready to quit at this time. Continue to monitor.       Situational anxiety    Mr. Frank Moore has new onset anxiety with severity that may be close to panic attack levels related to work. We discussed stress  and stress management techniques and the option that a counselor is available as needed. After discussion will start on buspirone. He is contemplating seeking new employment. Will follow up in 1 month or sooner if needed.       Relevant Medications   busPIRone (BUSPAR) 7.5 MG tablet    Other Visit Diagnoses    Screening for STDs (sexually transmitted diseases)       Relevant Orders   RPR       I have changed Frank Moore Tivicay and Descovy. I am also having him start on busPIRone. Additionally, I am having him maintain his ibuprofen, sildenafil, and valACYclovir.   Meds ordered this encounter  Medications  . busPIRone (BUSPAR) 7.5 MG tablet    Sig: Take 1 tablet (7.5 mg total) by mouth 2 (two) times daily.    Dispense:  60 tablet    Refill:  2    Order Specific Question:   Supervising Provider    Answer:   Judyann Munson [[id number]]  . dolutegravir (TIVICAY) 50 MG tablet    Sig: TAKE 1 TABLET(50 MG) BY MOUTH DAILY    Dispense:  30 tablet    Refill:  5    Order Specific Question:   Supervising Provider    Answer:   Judyann Munson [[id number]]  . emtricitabine-tenofovir AF (DESCOVY) 200-25 MG tablet    Sig: Take 1 tablet by mouth daily.    Dispense:  30 tablet    Refill:  5    Order Specific Question:   Supervising Provider    Answer:   Judyann Munson [[id number]]     Follow-up: Return in about 1 month (around 05/06/2019), or if symptoms worsen or fail to improve.   Marcos Eke, MSN, FNP-C Nurse  Practitioner [facility] Main number: [phone]

## 2019-04-06 DIAGNOSIS — F418 Other specified anxiety disorders: Secondary | ICD-10-CM | POA: Insufficient documentation

## 2019-04-06 NOTE — Assessment & Plan Note (Signed)
Mr. Vest has well controlled HIV disease with good adherence and tolerance to his ART regimen of Tivicay and Descovy. No signs/symptoms of opportunistic infection or progressive HIV disease. We reviewed his lab work and discussed the plan of care. Financial assistance is up to date. Continue current dose of Tivicay and Descovy. Plan for follow up in 3 months or sooner if needed.

## 2019-04-06 NOTE — Assessment & Plan Note (Signed)
Frank Moore continues to smoke tobacco at a rate of 1/4 pack per day on average. Discussed importance of tobacco cessation to reduce risk of cardiovascular, respiratory or malignant disease in the future. He is in the pre-contemplation stage and not ready to quit at this time. Continue to monitor.

## 2019-04-06 NOTE — Assessment & Plan Note (Signed)
Frank Moore has new onset anxiety with severity that may be close to panic attack levels related to work. We discussed stress and stress management techniques and the option that a counselor is available as needed. After discussion will start on buspirone. He is contemplating seeking new employment. Will follow up in 1 month or sooner if needed.

## 2019-05-23 ENCOUNTER — Encounter: Payer: Self-pay | Admitting: Family

## 2019-05-23 ENCOUNTER — Other Ambulatory Visit: Payer: Self-pay

## 2019-05-23 ENCOUNTER — Ambulatory Visit (INDEPENDENT_AMBULATORY_CARE_PROVIDER_SITE_OTHER): Payer: Self-pay | Admitting: Family

## 2019-05-23 VITALS — BP 120/79 | HR 70 | Temp 98.4°F | Ht 70.5 in | Wt 175.4 lb

## 2019-05-23 DIAGNOSIS — Z0000 Encounter for general adult medical examination without abnormal findings: Secondary | ICD-10-CM

## 2019-05-23 DIAGNOSIS — F418 Other specified anxiety disorders: Secondary | ICD-10-CM

## 2019-05-23 DIAGNOSIS — B20 Human immunodeficiency virus [HIV] disease: Secondary | ICD-10-CM

## 2019-05-23 DIAGNOSIS — Z113 Encounter for screening for infections with a predominantly sexual mode of transmission: Secondary | ICD-10-CM

## 2019-05-23 MED ORDER — DESCOVY 200-25 MG PO TABS: 1.0000 | ORAL_TABLET | Freq: Every day | ORAL | 5 refills | Status: DC

## 2019-05-23 MED ORDER — TIVICAY 50 MG PO TABS: ORAL_TABLET | ORAL | 5 refills | Status: DC

## 2019-05-23 NOTE — Patient Instructions (Signed)
Nice to see you.  We will check your blood work today.  Continue to take your Tivicay and Descovy as prescribed daily.  Schedule an appointment for follow up in 3 months or sooner if needed with financial to renew your ADAP/UMAP and also for follow up.  Have a great day and stay safe!

## 2019-05-23 NOTE — Progress Notes (Signed)
Subjective:    Patient ID: Frank Moore, male    DOB: 1980/12/08, 39 y.o.   MRN: [id number]  Chief Complaint  Patient presents with  . Follow-up    1 month follow, pt is wearing back brace for back pain      HPI:  Frank Moore is a 39 y.o. male with HIV disease and anxiety who was last seen in the office on 04/05/19 with good adherence and tolerance to his ART regimen of Tivicay and Descovy.  Viral load at the time was undetectable with CD4 count of 757.  He is experiencing increasing levels of anxiety and was started on buspirone.  Frank Moore has been taking his Biktarvy and buspirone as prescribed with no adverse side effects or missed doses.  Overall feeling well today with no new concerns/complaints and improved anxiety levels. Denies fevers, chills, night sweats, headaches, changes in vision, neck pain/stiffness, nausea, diarrhea, vomiting, lesions or rashes.  Frank Moore has no problems obtaining his medication from the pharmacy and remains covered through [employer] which will need to be renewed in July.  Denies feelings of being down, depressed, or hopeless recently.  He continues to smoke marijuana approximately 2 times per month and is a current everyday smoker of tobacco at a rate of quarter pack per day.  He drinks alcohol on occasion.  Has not been sexually active recently and declines condoms.  Continues to work full-time.  Now wearing a back brace secondary to back pain.   No Known Allergies    Outpatient Medications Prior to Visit  Medication Sig Dispense Refill  . busPIRone (BUSPAR) 7.5 MG tablet Take 1 tablet (7.5 mg total) by mouth 2 (two) times daily. 60 tablet 2  . ibuprofen (ADVIL,MOTRIN) 200 MG tablet Take 400 mg by mouth every 6 (six) hours as needed.    . sildenafil (VIAGRA) 25 MG tablet Take 1 tablet (25 mg total) by mouth daily as needed for erectile dysfunction. 30 tablet 0  . valACYclovir (VALTREX) 500 MG tablet TAKE 1 TABLET(500 MG) BY MOUTH TWICE DAILY 10  tablet 11  . dolutegravir (TIVICAY) 50 MG tablet TAKE 1 TABLET(50 MG) BY MOUTH DAILY 30 tablet 5  . emtricitabine-tenofovir AF (DESCOVY) 200-25 MG tablet Take 1 tablet by mouth daily. 30 tablet 5   No facility-administered medications prior to visit.     Past Medical History:  Diagnosis Date  . Depression   . HIV infection (HCC)      History reviewed. No pertinent surgical history.     Review of Systems  Constitutional: Negative for appetite change, chills, fatigue, fever and unexpected weight change.  Eyes: Negative for visual disturbance.  Respiratory: Negative for cough, chest tightness, shortness of breath and wheezing.   Cardiovascular: Negative for chest pain and leg swelling.  Gastrointestinal: Negative for abdominal pain, constipation, diarrhea, nausea and vomiting.  Genitourinary: Negative for dysuria, flank pain, frequency, genital sores, hematuria and urgency.  Skin: Negative for rash.  Allergic/Immunologic: Negative for immunocompromised state.  Neurological: Negative for dizziness and headaches.      Objective:    BP 120/79 (BP Location: Left Arm)   Pulse 70   Temp 98.4 F (36.9 C)   Ht 5' 10.5" (1.791 m)   Wt 175 lb 6.4 oz (79.6 kg)   SpO2 98%   BMI 24.81 kg/m  Nursing note and vital signs reviewed.  Physical Exam Constitutional:      General: He is not in acute distress.    Appearance: He is  well-developed.  Eyes:     Conjunctiva/sclera: Conjunctivae normal.  Cardiovascular:     Rate and Rhythm: Normal rate and regular rhythm.     Heart sounds: Normal heart sounds. No murmur. No friction rub. No gallop.   Pulmonary:     Effort: Pulmonary effort is normal. No respiratory distress.     Breath sounds: Normal breath sounds. No wheezing or rales.  Chest:     Chest wall: No tenderness.  Abdominal:     General: Bowel sounds are normal.     Palpations: Abdomen is soft.     Tenderness: There is no abdominal tenderness.  Musculoskeletal:      Cervical back: Neck supple.  Lymphadenopathy:     Cervical: No cervical adenopathy.  Skin:    General: Skin is warm and dry.     Findings: No rash.  Neurological:     Mental Status: He is alert and oriented to person, place, and time.  Psychiatric:        Behavior: Behavior normal.        Thought Content: Thought content normal.        Judgment: Judgment normal.      Depression screen [facility] 2/9 04/05/2019 10/06/2018 09/17/2017 02/19/2017 08/19/2016  Decreased Interest 1 0 0 0 0  Down, Depressed, Hopeless 1 0 0 0 0  PHQ - 2 Score 2 0 0 0 0  Altered sleeping 2 - - - -  Tired, decreased energy 3 - - - -  Change in appetite 0 - - - -  Feeling bad or failure about yourself  0 - - - -  Trouble concentrating 0 - - - -  Moving slowly or fidgety/restless 0 - - - -  Suicidal thoughts 0 - - - -  PHQ-9 Score 7 - - - -  Difficult doing work/chores Somewhat difficult - - - -       Assessment & Plan:    Patient Active Problem List   Diagnosis Date Noted  . HIV disease ([place]) 01/10/2016    Priority: High  . Situational anxiety 04/06/2019  . Healthcare maintenance 10/06/2018  . Erectile dysfunction 09/17/2017  . Tinea versicolor 08/19/2016  . Genital herpes 05/06/2016  . Poor dentition 05/06/2016  . Depression 01/11/2016  . Unintentional weight loss 01/11/2016  . Cigarette smoker 01/11/2016     Problem List Items Addressed This Visit      Other   HIV disease ([place]) - Primary (Chronic)    Mr. Frank Moore has well-controlled HIV disease with good adherence and tolerance to his ART regimen of Biktarvy.  No signs/symptoms of opportunistic infection or progressive HIV disease.  He has no problems obtaining his medication from the pharmacy.  We reviewed previous lab work and discussed plan of care.  Recheck blood work today.  Will need to renew financial assistance after July 1.  Continue current dose of Tivicay and Descovy.  Plan for follow-up in 3 months or sooner if needed with lab work on the same  day.      Relevant Medications   emtricitabine-tenofovir AF (DESCOVY) 200-25 MG tablet   dolutegravir (TIVICAY) 50 MG tablet   Other Relevant Orders   HIV-1 RNA quant-no reflex-bld   T-helper cell (CD4)- ([facility] clinic only)   COMPLETE METABOLIC PANEL WITH GFR   Healthcare maintenance     Hold vaccines today secondary to possible Covid vaccinations due for second dose of Menveo at next office visit  Discussed importance of safe sexual practice to reduce  risk of STI.  Condoms declined.      Situational anxiety    Anxiety anxiety significantly improved with current dose of buspirone.  Work is easier with less stress.  Continue current dose of buspirone.       Other Visit Diagnoses    Screening for STDs (sexually transmitted diseases)       Relevant Orders   RPR       I am having Wyvonnia Lora maintain his ibuprofen, sildenafil, valACYclovir, busPIRone, Descovy, and Tivicay.   Meds ordered this encounter  Medications  . emtricitabine-tenofovir AF (DESCOVY) 200-25 MG tablet    Sig: Take 1 tablet by mouth daily.    Dispense:  30 tablet    Refill:  5    Order Specific Question:   Supervising Provider    Answer:   Judyann Munson [[id number]]  . dolutegravir (TIVICAY) 50 MG tablet    Sig: TAKE 1 TABLET(50 MG) BY MOUTH DAILY    Dispense:  30 tablet    Refill:  5    Order Specific Question:   Supervising Provider    Answer:   Judyann Munson [[id number]]     Follow-up: Return in about 3 months (around 08/22/2019).   Marcos Eke, MSN, FNP-C Nurse Practitioner [facility] for Infectious Disease [facility] Main number: [phone]

## 2019-05-23 NOTE — Assessment & Plan Note (Signed)
Anxiety anxiety significantly improved with current dose of buspirone.  Work is easier with less stress.  Continue current dose of buspirone.

## 2019-05-23 NOTE — Assessment & Plan Note (Signed)
Mr. Ludlum has well-controlled HIV disease with good adherence and tolerance to his ART regimen of Biktarvy.  No signs/symptoms of opportunistic infection or progressive HIV disease.  He has no problems obtaining his medication from the pharmacy.  We reviewed previous lab work and discussed plan of care.  Recheck blood work today.  Will need to renew financial assistance after July 1.  Continue current dose of Tivicay and Descovy.  Plan for follow-up in 3 months or sooner if needed with lab work on the same day.

## 2019-05-23 NOTE — Assessment & Plan Note (Signed)
   Hold vaccines today secondary to possible Covid vaccinations due for second dose of Menveo at next office visit  Discussed importance of safe sexual practice to reduce risk of STI.  Condoms declined.

## 2019-05-24 LAB — T-HELPER CELL (CD4) - (RCID CLINIC ONLY)
CD4 % Helper T Cell: 46 % (ref 33–65)
CD4 T Cell Abs: 833 /uL (ref 400–1790)

## 2019-05-26 LAB — COMPLETE METABOLIC PANEL WITH GFR
AG Ratio: 1.8 (calc) (ref 1.0–2.5)
ALT: 17 U/L (ref 9–46)
AST: 17 U/L (ref 10–40)
Albumin: 4.3 g/dL (ref 3.6–5.1)
Alkaline phosphatase (APISO): 58 U/L (ref 36–130)
BUN: 18 mg/dL (ref 7–25)
CO2: 29 mmol/L (ref 20–32)
Calcium: 9.4 mg/dL (ref 8.6–10.3)
Chloride: 108 mmol/L (ref 98–110)
Creat: 1.08 mg/dL (ref 0.60–1.35)
GFR, Est African American: 100 mL/min/{1.73_m2} (ref >=60)
GFR, Est Non African American: 86 mL/min/{1.73_m2} (ref >=60)
Globulin: 2.4 g/dL (calc) (ref 1.9–3.7)
Glucose, Bld: 89 mg/dL (ref 65–99)
Potassium: 3.6 mmol/L (ref 3.5–5.3)
Sodium: 142 mmol/L (ref 135–146)
Total Bilirubin: 0.6 mg/dL (ref 0.2–1.2)
Total Protein: 6.7 g/dL (ref 6.1–8.1)

## 2019-05-26 LAB — HIV-1 RNA QUANT-NO REFLEX-BLD
HIV 1 RNA Quant: <20 copies/mL
HIV-1 RNA Quant, Log: <1.3 Log copies/mL

## 2019-05-26 LAB — RPR: RPR Ser Ql: NONREACTIVE

## 2019-07-19 NOTE — Telephone Encounter (Signed)
Spoke with Marcos Eke, FNP regarding note for work. Per Tammy Sours patient will need to be tested/ treated before letter could be written. Office is unable to test for streph, patient will need to go to Urgent Care or PCP for testing. No labs for streph were located in chart through [facility]. Relayed this to patient who will go to urgent care. Lorenso Courier, [place]

## 2019-08-23 ENCOUNTER — Ambulatory Visit: Payer: Self-pay

## 2019-08-23 ENCOUNTER — Ambulatory Visit: Payer: Self-pay | Admitting: Family

## 2019-09-02 ENCOUNTER — Other Ambulatory Visit: Payer: Self-pay | Admitting: Family

## 2019-09-02 DIAGNOSIS — F418 Other specified anxiety disorders: Secondary | ICD-10-CM

## 2019-10-03 ENCOUNTER — Other Ambulatory Visit: Payer: Self-pay

## 2019-10-03 ENCOUNTER — Ambulatory Visit: Payer: Self-pay

## 2019-10-03 DIAGNOSIS — Z113 Encounter for screening for infections with a predominantly sexual mode of transmission: Secondary | ICD-10-CM

## 2019-10-03 DIAGNOSIS — B20 Human immunodeficiency virus [HIV] disease: Secondary | ICD-10-CM

## 2019-10-04 ENCOUNTER — Encounter: Payer: Self-pay | Admitting: Family

## 2019-10-04 LAB — T-HELPER CELL (CD4) - (RCID CLINIC ONLY)
CD4 % Helper T Cell: 45 % (ref 33–65)
CD4 T Cell Abs: 647 /uL (ref 400–1790)

## 2019-10-10 ENCOUNTER — Other Ambulatory Visit: Payer: Self-pay | Admitting: Family

## 2019-10-10 DIAGNOSIS — F418 Other specified anxiety disorders: Secondary | ICD-10-CM

## 2019-10-18 ENCOUNTER — Ambulatory Visit (INDEPENDENT_AMBULATORY_CARE_PROVIDER_SITE_OTHER): Payer: Self-pay | Admitting: Family

## 2019-10-18 ENCOUNTER — Encounter: Payer: Self-pay | Admitting: Family

## 2019-10-18 ENCOUNTER — Other Ambulatory Visit: Payer: Self-pay

## 2019-10-18 VITALS — BP 129/83 | HR 94 | Temp 98.2°F | Wt 165.0 lb

## 2019-10-18 DIAGNOSIS — Z79899 Other long term (current) drug therapy: Secondary | ICD-10-CM

## 2019-10-18 DIAGNOSIS — Z0000 Encounter for general adult medical examination without abnormal findings: Secondary | ICD-10-CM

## 2019-10-18 DIAGNOSIS — Z113 Encounter for screening for infections with a predominantly sexual mode of transmission: Secondary | ICD-10-CM

## 2019-10-18 DIAGNOSIS — B20 Human immunodeficiency virus [HIV] disease: Secondary | ICD-10-CM

## 2019-10-18 MED ORDER — TIVICAY 50 MG PO TABS: ORAL_TABLET | ORAL | 5 refills | Status: DC

## 2019-10-18 MED ORDER — DESCOVY 200-25 MG PO TABS: 1.0000 | ORAL_TABLET | Freq: Every day | ORAL | 5 refills | Status: DC

## 2019-10-18 NOTE — Progress Notes (Signed)
Subjective:    Patient ID: Frank Moore, male    DOB: February 07, 1980, 39 y.o.   MRN: [id number]  Chief Complaint  Patient presents with   Follow-up    B20     HPI:  Frank Moore is a 39 y.o. male with HIV disease with risk factors including MSM who was last seen in the office on 05/23/2019 with good adherence and tolerance to his ART regimen of Tivicay and Descovy.  Blood work at the time showed a viral load that was undetectable with CD4 count of 833.  Most recent blood work completed on 10/03/2019 with CD4 count of 647 and viral load that remains pending.  Here today for routine follow-up.  Frank Moore continues to take his Tivicay and Descovy daily as prescribed with no adverse side effects or missed doses since his last office visit.  Overall feeling well today with no new concerns/complaints. Denies fevers, chills, night sweats, headaches, changes in vision, neck pain/stiffness, nausea, diarrhea, vomiting, lesions or rashes.  Frank Moore has no problems obtaining his medication from the pharmacy.  Denies feelings of being down, depressed, or hopeless recently.  His partner is currently depressed.  He uses marijuana on a daily basis and smokes tobacco half pack per day on average with occasional alcohol consumption.  Declines condoms.  Would like to have his flu shot today.  Routine dental care is up-to-date.  No Known Allergies    Outpatient Medications Prior to Visit  Medication Sig Dispense Refill   busPIRone (BUSPAR) 7.5 MG tablet TAKE 1 TABLET(7.5 MG) BY MOUTH TWICE DAILY 60 tablet 0   ibuprofen (ADVIL,MOTRIN) 200 MG tablet Take 400 mg by mouth every 6 (six) hours as needed.     dolutegravir (TIVICAY) 50 MG tablet TAKE 1 TABLET(50 MG) BY MOUTH DAILY 30 tablet 5   emtricitabine-tenofovir AF (DESCOVY) 200-25 MG tablet Take 1 tablet by mouth daily. 30 tablet 5   sildenafil (VIAGRA) 25 MG tablet Take 1 tablet (25 mg total) by mouth daily as needed for erectile dysfunction. 30 tablet  0   valACYclovir (VALTREX) 500 MG tablet TAKE 1 TABLET(500 MG) BY MOUTH TWICE DAILY (Patient not taking: Reported on 10/18/2019) 10 tablet 11   No facility-administered medications prior to visit.     Past Medical History:  Diagnosis Date   Depression    HIV infection (HCC)      History reviewed. No pertinent surgical history.     Review of Systems  Constitutional: Negative for appetite change, chills, fatigue, fever and unexpected weight change.  Eyes: Negative for visual disturbance.  Respiratory: Negative for cough, chest tightness, shortness of breath and wheezing.   Cardiovascular: Negative for chest pain and leg swelling.  Gastrointestinal: Negative for abdominal pain, constipation, diarrhea, nausea and vomiting.  Genitourinary: Negative for dysuria, flank pain, frequency, genital sores, hematuria and urgency.  Skin: Negative for rash.  Allergic/Immunologic: Negative for immunocompromised state.  Neurological: Negative for dizziness and headaches.      Objective:    BP 129/83    Pulse 94    Temp 98.2 F (36.8 C) (Oral)    Wt 165 lb (74.8 kg)    BMI 23.34 kg/m  Nursing note and vital signs reviewed.  Physical Exam Constitutional:      General: He is not in acute distress.    Appearance: He is well-developed.  Eyes:     Conjunctiva/sclera: Conjunctivae normal.  Cardiovascular:     Rate and Rhythm: Normal rate and regular rhythm.  Heart sounds: Normal heart sounds. No murmur heard.  No friction rub. No gallop.   Pulmonary:     Effort: Pulmonary effort is normal. No respiratory distress.     Breath sounds: Normal breath sounds. No wheezing or rales.  Chest:     Chest wall: No tenderness.  Abdominal:     General: Bowel sounds are normal.     Palpations: Abdomen is soft.     Tenderness: There is no abdominal tenderness.  Musculoskeletal:     Cervical back: Neck supple.  Lymphadenopathy:     Cervical: No cervical adenopathy.  Skin:    General: Skin is  warm and dry.     Findings: No rash.  Neurological:     Mental Status: He is alert and oriented to person, place, and time.  Psychiatric:        Behavior: Behavior normal.        Thought Content: Thought content normal.        Judgment: Judgment normal.      Depression screen [facility] 2/9 10/18/2019 04/05/2019 10/06/2018 09/17/2017 02/19/2017  Decreased Interest 1 1 0 0 0  Down, Depressed, Hopeless 1 1 0 0 0  PHQ - 2 Score 2 2 0 0 0  Altered sleeping 1 2 - - -  Tired, decreased energy 0 3 - - -  Change in appetite 0 0 - - -  Feeling bad or failure about yourself  0 0 - - -  Trouble concentrating 0 0 - - -  Moving slowly or fidgety/restless 0 0 - - -  Suicidal thoughts 0 0 - - -  PHQ-9 Score 3 7 - - -  Difficult doing work/chores - Somewhat difficult - - -       Assessment & Plan:    Patient Active Problem List   Diagnosis Date Noted   HIV disease (HCC) 01/10/2016    Priority: High   Situational anxiety 04/06/2019   Healthcare maintenance 10/06/2018   Erectile dysfunction 09/17/2017   Tinea versicolor 08/19/2016   Genital herpes 05/06/2016   Poor dentition 05/06/2016   Depression 01/11/2016   Unintentional weight loss 01/11/2016   Cigarette smoker 01/11/2016     Problem List Items Addressed This Visit      Other   HIV disease (HCC) - Primary (Chronic)    Frank Moore continues to have well-controlled HIV disease with good adherence and tolerance to his ART regimen of Tivicay and Descovy.  No signs/symptoms of opportunistic infection or progressive HIV disease.  We reviewed lab work and discussed plan of care.  Continue current dose of Tivicay and Descovy.  Plan for follow-up in 3 months or sooner if needed with lab 1 to 2 weeks prior to appointment or on same day.      Relevant Medications   dolutegravir (TIVICAY) 50 MG tablet   emtricitabine-tenofovir AF (DESCOVY) 200-25 MG tablet   Other Relevant Orders   HIV-1 RNA quant-no reflex-bld   T-helper cell (CD4)-  ([facility] clinic only)   Comprehensive metabolic panel   Healthcare maintenance     Influenza vaccination updated today.  Discussed/recommended Covid vaccine.  Discussed importance of safe sexual practice to reduce risk of STI.  Condoms declined.  Routine dental care up-to-date per recommendations.       Other Visit Diagnoses    Pharmacologic therapy       Relevant Orders   Lipid panel   Screening for STDs (sexually transmitted diseases)       Relevant Orders  RPR       I have discontinued Gibson Ramp sildenafil and valACYclovir. I am also having him maintain his ibuprofen, busPIRone, Tivicay, and Descovy.   Meds ordered this encounter  Medications   dolutegravir (TIVICAY) 50 MG tablet    Sig: TAKE 1 TABLET(50 MG) BY MOUTH DAILY    Dispense:  30 tablet    Refill:  5    Order Specific Question:   Supervising Provider    Answer:   Judyann Munson [[id number]]   emtricitabine-tenofovir AF (DESCOVY) 200-25 MG tablet    Sig: Take 1 tablet by mouth daily.    Dispense:  30 tablet    Refill:  5    Order Specific Question:   Supervising Provider    Answer:   Judyann Munson [[id number]]     Follow-up: Return in about 3 months (around 01/17/2020), or if symptoms worsen or fail to improve.   Marcos Eke, MSN, FNP-C Nurse Practitioner [facility] for Infectious Disease [facility] Main number: [phone]

## 2019-10-18 NOTE — Assessment & Plan Note (Addendum)
Mr. Robarts continues to have well-controlled HIV disease with good adherence and tolerance to his ART regimen of Tivicay and Descovy.  No signs/symptoms of opportunistic infection or progressive HIV disease.  We reviewed lab work and discussed plan of care.  Continue current dose of Tivicay and Descovy.  Plan for follow-up in 3 months or sooner if needed with lab 1 to 2 weeks prior to appointment or on same day.

## 2019-10-18 NOTE — Assessment & Plan Note (Signed)
   Influenza vaccination updated today.  Discussed/recommended Covid vaccine.  Discussed importance of safe sexual practice to reduce risk of STI.  Condoms declined.  Routine dental care up-to-date per recommendations.

## 2019-10-18 NOTE — Patient Instructions (Addendum)
Nice to see you.  We will continue your Tivicay and Descovy.   Refills will be sent to the pharmacy.  We will check your financial assistance today.   Plan for follow up office visit in 3 months or sooner if needed with lab work 1-2 weeks prior to appointment.   Have a great day and stay safe!

## 2019-10-19 LAB — COMPREHENSIVE METABOLIC PANEL
AG Ratio: 2.2 (calc) (ref 1.0–2.5)
ALT: 13 U/L (ref 9–46)
AST: 14 U/L (ref 10–40)
Albumin: 4.6 g/dL (ref 3.6–5.1)
Alkaline phosphatase (APISO): 53 U/L (ref 36–130)
BUN: 15 mg/dL (ref 7–25)
CO2: 29 mmol/L (ref 20–32)
Calcium: 9.5 mg/dL (ref 8.6–10.3)
Chloride: 105 mmol/L (ref 98–110)
Creat: 1.04 mg/dL (ref 0.60–1.35)
Globulin: 2.1 g/dL (calc) (ref 1.9–3.7)
Glucose, Bld: 90 mg/dL (ref 65–99)
Potassium: 3.7 mmol/L (ref 3.5–5.3)
Sodium: 140 mmol/L (ref 135–146)
Total Bilirubin: 0.8 mg/dL (ref 0.2–1.2)
Total Protein: 6.7 g/dL (ref 6.1–8.1)

## 2019-10-19 LAB — HIV-1 RNA QUANT-NO REFLEX-BLD

## 2019-10-19 LAB — SYPHILIS: RPR W/REFLEX TO RPR TITER AND TREPONEMAL ANTIBODIES, TRADITIONAL SCREENING AND DIAGNOSIS ALGORITHM: RPR Ser Ql: NONREACTIVE

## 2019-10-24 LAB — HIV-1 RNA QUANT-NO REFLEX-BLD
HIV 1 RNA Quant: <20 Copies/mL
HIV-1 RNA Quant, Log: <1.3 Log cps/mL

## 2020-01-10 ENCOUNTER — Ambulatory Visit: Payer: Self-pay | Admitting: Family

## 2020-02-08 ENCOUNTER — Telehealth: Payer: Self-pay | Admitting: Family Medicine

## 2020-02-08 NOTE — Telephone Encounter (Signed)
error 

## 2020-03-26 ENCOUNTER — Telehealth: Payer: Self-pay

## 2020-03-26 NOTE — Telephone Encounter (Signed)
Appointment Request From: Wyvonnia Lora  With Provider: Jeanine Luz, FNP [facility] for Infectious Disease]  Preferred Date Range: Any  Preferred Times: Any Time  Reason for visit: Office Visit  Comments: I've gotten 2 Vaccines now but on Valentine's Day I found the love of my life dead and yesterday was his funeral I'm very depressed and having lost a of anxiety now and to get help as soon as possible and I need to do my labs as well and reapply for financial aid towards my meds  CMA attempted to call patient today to offer 30 min appointment with Carver Fila, FNP at 9:45. Can do labs and financial today as well. Will provide patient with counseling information. Okay per provider to schedule appointment.

## 2020-03-29 ENCOUNTER — Ambulatory Visit: Payer: Self-pay | Admitting: Family

## 2020-03-30 ENCOUNTER — Ambulatory Visit: Payer: Self-pay

## 2020-03-30 ENCOUNTER — Other Ambulatory Visit: Payer: Self-pay

## 2020-03-30 ENCOUNTER — Encounter: Payer: Self-pay | Admitting: Family

## 2020-03-30 ENCOUNTER — Ambulatory Visit (INDEPENDENT_AMBULATORY_CARE_PROVIDER_SITE_OTHER): Payer: Self-pay | Admitting: Family

## 2020-03-30 VITALS — Wt 177.6 lb

## 2020-03-30 DIAGNOSIS — Z0000 Encounter for general adult medical examination without abnormal findings: Secondary | ICD-10-CM

## 2020-03-30 DIAGNOSIS — Z113 Encounter for screening for infections with a predominantly sexual mode of transmission: Secondary | ICD-10-CM

## 2020-03-30 DIAGNOSIS — Z79899 Other long term (current) drug therapy: Secondary | ICD-10-CM

## 2020-03-30 DIAGNOSIS — B20 Human immunodeficiency virus [HIV] disease: Secondary | ICD-10-CM

## 2020-03-30 DIAGNOSIS — F4321 Adjustment disorder with depressed mood: Secondary | ICD-10-CM

## 2020-03-30 DIAGNOSIS — F418 Other specified anxiety disorders: Secondary | ICD-10-CM

## 2020-03-30 MED ORDER — VALACYCLOVIR HCL 500 MG PO TABS: 500.0000 mg | ORAL_TABLET | Freq: Two times a day (BID) | ORAL | 2 refills | Status: AC

## 2020-03-30 MED ORDER — BUSPIRONE HCL 7.5 MG PO TABS
ORAL_TABLET | ORAL | 2 refills | Status: DC
Start: 2020-03-30 — End: 2020-04-23

## 2020-03-30 MED ORDER — TIVICAY 50 MG PO TABS: ORAL_TABLET | ORAL | 5 refills | Status: DC

## 2020-03-30 MED ORDER — DESCOVY 200-25 MG PO TABS
1.0000 | ORAL_TABLET | Freq: Every day | ORAL | 5 refills | Status: DC
Start: 2020-03-30 — End: 2020-09-14

## 2020-03-30 NOTE — Progress Notes (Signed)
Subjective:    Patient ID: Frank Moore, male    DOB: 1980/05/31, 40 y.o.   MRN: [id number]  Chief Complaint  Patient presents with  . Follow-up     HPI:  Frank Moore is a 40 y.o. male with HIV disease last seen on 10/18/2019 with well-controlled HIV disease with good adherence and tolerance to his ART regimen of Tivicay and Descovy.  Lab work at this time showed a viral load that was undetectable and CD4 count of 647.  No recent blood work completed.  Here today for routine follow-up.  Frank Moore continues to take his Tivicay and Descovy daily as prescribed with no adverse side effects or missed doses since his last office visit. Denies fevers, chills, night sweats, headaches, changes in vision, neck pain/stiffness, nausea, diarrhea, vomiting, lesions or rashes.  Frank Moore has no problems obtaining medications from the pharmacy. Been having severe grief and depression as he lost his partner a couple of weeks ago who overdosed on fentanyl. His partner had been battling severe depression since his father died. Currently has been out of work for the past two weeks and feeling levels of increased anxiety and decreased motivation to get out of bed. Initially he had thoughts of harming himself and is not currently experiencing any. This office visit is the first he has been out. Does have his parents for support and has not discussed with them in detail. Has increased the amount of marijuana that he is smoking.   Lost partner to overdose of fentanyl ever since his father died recently. Having a lot of anxiety and depression and has not been to work, and has not motivation or energy.  Has family living across the street. Smoking a lot of marijuana. Has thoughts harming himself initially but not now. Due to renew financial assistance.    No Known Allergies    Outpatient Medications Prior to Visit  Medication Sig Dispense Refill  . ibuprofen (ADVIL,MOTRIN) 200 MG tablet Take 400 mg by mouth  every 6 (six) hours as needed.    . busPIRone (BUSPAR) 7.5 MG tablet TAKE 1 TABLET(7.5 MG) BY MOUTH TWICE DAILY 60 tablet 0  . dolutegravir (TIVICAY) 50 MG tablet TAKE 1 TABLET(50 MG) BY MOUTH DAILY 30 tablet 5  . emtricitabine-tenofovir AF (DESCOVY) 200-25 MG tablet Take 1 tablet by mouth daily. 30 tablet 5   No facility-administered medications prior to visit.     Past Medical History:  Diagnosis Date  . Depression   . HIV infection (HCC)      History reviewed. No pertinent surgical history.     Review of Systems  Constitutional: Negative for appetite change, chills, fatigue, fever and unexpected weight change.  Eyes: Negative for visual disturbance.  Respiratory: Negative for cough, chest tightness, shortness of breath and wheezing.   Cardiovascular: Negative for chest pain and leg swelling.  Gastrointestinal: Negative for abdominal pain, constipation, diarrhea, nausea and vomiting.  Genitourinary: Negative for dysuria, flank pain, frequency, genital sores, hematuria and urgency.  Skin: Negative for rash.  Allergic/Immunologic: Negative for immunocompromised state.  Neurological: Negative for dizziness and headaches.      Objective:    Wt 177 lb 9.6 oz (80.6 kg)   BMI 25.12 kg/m  Nursing note and vital signs reviewed.  Physical Exam Constitutional:      General: He is not in acute distress.    Appearance: He is well-developed.  HENT:     Mouth/Throat:     Mouth: Oropharynx is clear and  moist.  Eyes:     Conjunctiva/sclera: Conjunctivae normal.  Cardiovascular:     Rate and Rhythm: Normal rate and regular rhythm.     Pulses: Intact distal pulses.     Heart sounds: Normal heart sounds. No murmur heard. No friction rub. No gallop.   Pulmonary:     Effort: Pulmonary effort is normal. No respiratory distress.     Breath sounds: Normal breath sounds. No wheezing or rales.  Chest:     Chest wall: No tenderness.  Abdominal:     General: Bowel sounds are normal.      Palpations: Abdomen is soft.     Tenderness: There is no abdominal tenderness.  Musculoskeletal:     Cervical back: Neck supple.  Lymphadenopathy:     Cervical: No cervical adenopathy.  Skin:    General: Skin is warm and dry.     Findings: No rash.  Neurological:     Mental Status: He is alert and oriented to person, place, and time.  Psychiatric:        Mood and Affect: Mood and affect normal.        Behavior: Behavior normal.        Thought Content: Thought content normal.        Judgment: Judgment normal.      Depression screen [facility] 2/9 03/30/2020 10/18/2019 04/05/2019 10/06/2018 09/17/2017  Decreased Interest 1 1 1  0 0  Down, Depressed, Hopeless 1 1 1  0 0  PHQ - 2 Score 2 2 2  0 0  Altered sleeping 1 1 2  - -  Tired, decreased energy 1 0 3 - -  Change in appetite 1 0 0 - -  Feeling bad or failure about yourself  1 0 0 - -  Trouble concentrating 1 0 0 - -  Moving slowly or fidgety/restless 0 0 0 - -  Suicidal thoughts 0 0 0 - -  PHQ-9 Score 7 3 7  - -  Difficult doing work/chores - - Somewhat difficult - -       Assessment & Plan:    Patient Active Problem List   Diagnosis Date Noted  . HIV disease (HCC) 01/10/2016    Priority: High  . Grief 03/30/2020  . Situational anxiety 04/06/2019  . Healthcare maintenance 10/06/2018  . Erectile dysfunction 09/17/2017  . Tinea versicolor 08/19/2016  . Genital herpes 05/06/2016  . Poor dentition 05/06/2016  . Depression 01/11/2016  . Unintentional weight loss 01/11/2016  . Cigarette smoker 01/11/2016     Problem List Items Addressed This Visit      Other   HIV disease (HCC) - Primary (Chronic)    Frank Moore continues to have well-controlled HIV disease with good adherence and tolerance to his ART regimen Tivicay and Descovy.  No signs/symptoms of opportunistic infection or progressive HIV disease.  We reviewed previous lab work and discussed plan of care.  Check blood work today.  Continue current dose of Biktarvy.  Plan  for follow-up in 3 months or sooner if needed.       Relevant Medications   valACYclovir (VALTREX) 500 MG tablet   dolutegravir (TIVICAY) 50 MG tablet   emtricitabine-tenofovir AF (DESCOVY) 200-25 MG tablet   Other Relevant Orders   COMPLETE METABOLIC PANEL WITH GFR   HIV-1 RNA quant-no reflex-bld   T-helper cell (CD4)- ([facility] clinic only)   T-helper cells (CD4) count   Healthcare maintenance     Condoms offered and declined.   Declines vaccines today.  Situational anxiety   Relevant Medications   busPIRone (BUSPAR) 7.5 MG tablet   Grief    Mr. Aldredge has grief related to the recent unexpected loss of his partner which has resulted in an exacerbation of his depression and anxiety. Discussed today various coping mechanisms and he has support in place as needed. No current suicidal ideation or signs of psychosis. We discussed counseling and medication and right now wishes to restart prior Buspar for anxiety and see how he does. Recommended that he seek additional emergency care if symptoms worsen or thoughts of harming himself develop. Will follow up in 1 week to ensure he is not progressing to complicated grief.        Other Visit Diagnoses    Screening for STDs (sexually transmitted diseases)       Relevant Orders   RPR   Urine cytology ancillary only([place])   Pharmacologic therapy       Relevant Orders   Lipid panel       I have changed Dre Warnick's busPIRone. I am also having him start on valACYclovir. Additionally, I am having him maintain his ibuprofen, Tivicay, and Descovy.   Meds ordered this encounter  Medications  . valACYclovir (VALTREX) 500 MG tablet    Sig: Take 1 tablet (500 mg total) by mouth 2 (two) times daily.    Dispense:  60 tablet    Refill:  2    Order Specific Question:   Supervising Provider    Answer:   Judyann Munson [[id number]]  . dolutegravir (TIVICAY) 50 MG tablet    Sig: TAKE 1 TABLET(50 MG) BY MOUTH DAILY    Dispense:  30 tablet     Refill:  5    Order Specific Question:   Supervising Provider    Answer:   Judyann Munson [[id number]]  . emtricitabine-tenofovir AF (DESCOVY) 200-25 MG tablet    Sig: Take 1 tablet by mouth daily.    Dispense:  30 tablet    Refill:  5    Order Specific Question:   Supervising Provider    Answer:   Judyann Munson [[id number]]  . busPIRone (BUSPAR) 7.5 MG tablet    Sig: TAKE 1 TABLET(7.5 MG) BY MOUTH 3 times DAILY    Dispense:  90 tablet    Refill:  2    Order Specific Question:   Supervising Provider    Answer:   Judyann Munson [[id number]]     Follow-up: Return in about 1 week (around 04/06/2020), or if symptoms worsen or fail to improve.   Marcos Eke, MSN, FNP-C Nurse Practitioner [facility] for Infectious Disease [facility] Main number: [phone]

## 2020-03-30 NOTE — Assessment & Plan Note (Signed)
Mr. Repass continues to have well-controlled HIV disease with good adherence and tolerance to his ART regimen Tivicay and Descovy.  No signs/symptoms of opportunistic infection or progressive HIV disease.  We reviewed previous lab work and discussed plan of care.  Check blood work today.  Continue current dose of Biktarvy.  Plan for follow-up in 3 months or sooner if needed.

## 2020-03-30 NOTE — Patient Instructions (Signed)
Nice to see you.   Continue to take your medications and restart taking Buspar.   We will get your financial assistance renewed.   Recommend counseling.   Plan to follow up in 1 week or sooner through telehealth visit.   Have a good day!

## 2020-03-30 NOTE — Assessment & Plan Note (Signed)
   Condoms offered and declined.   Declines vaccines today.

## 2020-03-30 NOTE — Assessment & Plan Note (Signed)
Mr. Hageman has grief related to the recent unexpected loss of his partner which has resulted in an exacerbation of his depression and anxiety. Discussed today various coping mechanisms and he has support in place as needed. No current suicidal ideation or signs of psychosis. We discussed counseling and medication and right now wishes to restart prior Buspar for anxiety and see how he does. Recommended that he seek additional emergency care if symptoms worsen or thoughts of harming himself develop. Will follow up in 1 week to ensure he is not progressing to complicated grief.

## 2020-04-02 LAB — COMPLETE METABOLIC PANEL WITH GFR
AG Ratio: 1.6 (calc) (ref 1.0–2.5)
ALT: 20 U/L (ref 9–46)
AST: 16 U/L (ref 10–40)
Albumin: 4 g/dL (ref 3.6–5.1)
Alkaline phosphatase (APISO): 61 U/L (ref 36–130)
BUN: 11 mg/dL (ref 7–25)
CO2: 29 mmol/L (ref 20–32)
Calcium: 9.2 mg/dL (ref 8.6–10.3)
Chloride: 107 mmol/L (ref 98–110)
Creat: 0.97 mg/dL (ref 0.60–1.35)
GFR, Est African American: 113 mL/min/{1.73_m2} (ref >=60)
GFR, Est Non African American: 97 mL/min/{1.73_m2} (ref >=60)
Globulin: 2.5 g/dL (calc) (ref 1.9–3.7)
Glucose, Bld: 81 mg/dL (ref 65–99)
Potassium: 3.9 mmol/L (ref 3.5–5.3)
Sodium: 142 mmol/L (ref 135–146)
Total Bilirubin: 0.7 mg/dL (ref 0.2–1.2)
Total Protein: 6.5 g/dL (ref 6.1–8.1)

## 2020-04-02 LAB — HIV-1 RNA QUANT-NO REFLEX-BLD
HIV 1 RNA Quant: <20 Copies/mL
HIV-1 RNA Quant, Log: <1.3 Log cps/mL

## 2020-04-02 LAB — LIPID PANEL
Cholesterol: 154 mg/dL (ref <200)
HDL: 50 mg/dL (ref >=40)
LDL Cholesterol (Calc): 90 mg/dL (calc)
Non-HDL Cholesterol (Calc): 104 mg/dL (calc) (ref <130)
Total CHOL/HDL Ratio: 3.1 (calc) (ref <5.0)
Triglycerides: 57 mg/dL (ref <150)

## 2020-04-02 LAB — RPR: RPR Ser Ql: NONREACTIVE

## 2020-04-02 LAB — URINE CYTOLOGY ANCILLARY ONLY
Chlamydia: NEGATIVE
Comment: NEGATIVE
Comment: NORMAL
Neisseria Gonorrhea: NEGATIVE

## 2020-04-23 MED ORDER — ESCITALOPRAM OXALATE 10 MG PO TABS: 10.0000 mg | ORAL_TABLET | Freq: Every day | ORAL | 2 refills | Status: DC

## 2020-04-25 ENCOUNTER — Telehealth: Payer: Self-pay | Admitting: Family

## 2020-04-25 ENCOUNTER — Other Ambulatory Visit: Payer: Self-pay

## 2020-06-25 ENCOUNTER — Other Ambulatory Visit: Payer: Self-pay

## 2020-06-25 ENCOUNTER — Telehealth: Payer: Self-pay

## 2020-06-25 ENCOUNTER — Ambulatory Visit: Payer: Self-pay

## 2020-06-25 DIAGNOSIS — B20 Human immunodeficiency virus [HIV] disease: Secondary | ICD-10-CM

## 2020-06-25 NOTE — Telephone Encounter (Signed)
[facility] Patient Advocate Encounter  Completed and sent [employer] Advancing Access application for Descovy for this patient who is uninsured.    Patient is approved 06/25/21.       Clearance Coots, CPhT Specialty Pharmacy Patient [facility] for Infectious Disease Phone: [phone] Fax:  [phone]

## 2020-06-26 ENCOUNTER — Encounter: Payer: Self-pay | Admitting: Family

## 2020-06-26 LAB — T-HELPER CELL (CD4) - (RCID CLINIC ONLY)
CD4 % Helper T Cell: 31 % — ABNORMAL LOW (ref 33–65)
CD4 T Cell Abs: 484 /uL (ref 400–1790)

## 2020-06-27 LAB — HIV-1 RNA QUANT-NO REFLEX-BLD
HIV 1 RNA Quant: NOT DETECTED Copies/mL
HIV-1 RNA Quant, Log: NOT DETECTED Log cps/mL

## 2020-06-27 LAB — COMPLETE METABOLIC PANEL WITH GFR
AG Ratio: 1.7 (calc) (ref 1.0–2.5)
ALT: 75 U/L — ABNORMAL HIGH (ref 9–46)
AST: 29 U/L (ref 10–40)
Albumin: 4.5 g/dL (ref 3.6–5.1)
Alkaline phosphatase (APISO): 69 U/L (ref 36–130)
BUN: 10 mg/dL (ref 7–25)
CO2: 30 mmol/L (ref 20–32)
Calcium: 9.4 mg/dL (ref 8.6–10.3)
Chloride: 103 mmol/L (ref 98–110)
Creat: 1.01 mg/dL (ref 0.60–1.35)
GFR, Est African American: 107 mL/min/{1.73_m2} (ref >=60)
GFR, Est Non African American: 93 mL/min/{1.73_m2} (ref >=60)
Globulin: 2.7 g/dL (calc) (ref 1.9–3.7)
Glucose, Bld: 94 mg/dL (ref 65–99)
Potassium: 3.9 mmol/L (ref 3.5–5.3)
Sodium: 140 mmol/L (ref 135–146)
Total Bilirubin: 0.8 mg/dL (ref 0.2–1.2)
Total Protein: 7.2 g/dL (ref 6.1–8.1)

## 2020-06-27 LAB — CBC WITH DIFFERENTIAL/PLATELET
Absolute Monocytes: 444 cells/uL (ref 200–950)
Basophils Absolute: 30 cells/uL (ref 0–200)
Basophils Relative: 0.5 %
Eosinophils Absolute: 30 cells/uL (ref 15–500)
Eosinophils Relative: 0.5 %
HCT: 44.9 % (ref 38.5–50.0)
Hemoglobin: 14.2 g/dL (ref 13.2–17.1)
Lymphs Abs: 1746 cells/uL (ref 850–3900)
MCH: 26.2 pg — ABNORMAL LOW (ref 27.0–33.0)
MCHC: 31.6 g/dL — ABNORMAL LOW (ref 32.0–36.0)
MCV: 83 fL (ref 80.0–100.0)
MPV: 10.1 fL (ref 7.5–12.5)
Monocytes Relative: 7.4 %
Neutro Abs: 3750 cells/uL (ref 1500–7800)
Neutrophils Relative %: 62.5 %
Platelets: 369 10*3/uL (ref 140–400)
RBC: 5.41 10*6/uL (ref 4.20–5.80)
RDW: 13.3 % (ref 11.0–15.0)
Total Lymphocyte: 29.1 %
WBC: 6 10*3/uL (ref 3.8–10.8)

## 2020-07-16 ENCOUNTER — Ambulatory Visit: Payer: Self-pay | Admitting: Family

## 2020-07-16 ENCOUNTER — Other Ambulatory Visit: Payer: Self-pay | Admitting: Infectious Diseases

## 2020-07-16 DIAGNOSIS — B20 Human immunodeficiency virus [HIV] disease: Secondary | ICD-10-CM

## 2020-08-23 ENCOUNTER — Telehealth: Payer: Self-pay

## 2020-08-23 NOTE — Telephone Encounter (Signed)
Left a voice mail to call back to schedule an appointment with [facility] and financial after a request we received. Patient comments are "Need to talk to him about my depression meds and also reapply for adapt: requested Thursday anytime and Friday mornings.

## 2020-09-11 ENCOUNTER — Telehealth: Payer: Self-pay

## 2020-09-11 ENCOUNTER — Other Ambulatory Visit: Payer: Self-pay | Admitting: Family

## 2020-09-11 ENCOUNTER — Other Ambulatory Visit: Payer: Self-pay

## 2020-09-11 ENCOUNTER — Ambulatory Visit: Payer: Self-pay

## 2020-09-11 ENCOUNTER — Ambulatory Visit: Payer: Self-pay | Admitting: Family

## 2020-09-11 MED ORDER — ESCITALOPRAM OXALATE 10 MG PO TABS: 10.0000 mg | ORAL_TABLET | Freq: Every day | ORAL | 2 refills | Status: DC
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
## 2020-09-14 ENCOUNTER — Other Ambulatory Visit: Payer: Self-pay

## 2020-09-14 ENCOUNTER — Ambulatory Visit: Payer: Self-pay

## 2020-09-14 ENCOUNTER — Ambulatory Visit (INDEPENDENT_AMBULATORY_CARE_PROVIDER_SITE_OTHER): Payer: Self-pay | Admitting: Family

## 2020-09-14 ENCOUNTER — Encounter: Payer: Self-pay | Admitting: Family

## 2020-09-14 VITALS — BP 128/87 | HR 92 | Temp 98.3°F | Wt 173.0 lb

## 2020-09-14 DIAGNOSIS — B20 Human immunodeficiency virus [HIV] disease: Secondary | ICD-10-CM

## 2020-09-14 DIAGNOSIS — Z0000 Encounter for general adult medical examination without abnormal findings: Secondary | ICD-10-CM

## 2020-09-14 DIAGNOSIS — F4321 Adjustment disorder with depressed mood: Secondary | ICD-10-CM

## 2020-09-14 DIAGNOSIS — Z23 Encounter for immunization: Secondary | ICD-10-CM

## 2020-09-14 DIAGNOSIS — F331 Major depressive disorder, recurrent, moderate: Secondary | ICD-10-CM

## 2020-09-14 DIAGNOSIS — Z113 Encounter for screening for infections with a predominantly sexual mode of transmission: Secondary | ICD-10-CM

## 2020-09-14 MED ORDER — TIVICAY 50 MG PO TABS: ORAL_TABLET | ORAL | 5 refills | Status: DC

## 2020-09-14 MED ORDER — ESCITALOPRAM OXALATE 10 MG PO TABS: 10.0000 mg | ORAL_TABLET | Freq: Every day | ORAL | 2 refills | Status: DC

## 2020-09-14 MED ORDER — DESCOVY 200-25 MG PO TABS: 1.0000 | ORAL_TABLET | Freq: Every day | ORAL | 5 refills | Status: DC

## 2020-09-14 NOTE — Assessment & Plan Note (Signed)
Frank Moore continues to have grief secondary to the recent loss of his grandmother and current sickness of his aunt.  He is having trouble getting Lexapro from the pharmacy.  Our pharmacy staff has contacted respective pharmacy and was told the medication is no longer on formulary.  Prescription sent to a different pharmacy.  We will follow-up if continues to have problems getting Lexapro.

## 2020-09-14 NOTE — Assessment & Plan Note (Signed)
Frank Moore continues to have well-controlled virus with good adherence and tolerance to his ART regimen of Tivicay and Descovy.  No signs/symptoms of opportunistic infection or progressive HIV.  We reviewed previous lab work and discussed plan of care.  Check blood work today.  Renew financial assistance.  Continue current dose of Tivicay/Descovy.  Plan for follow-up in 4 months or sooner if needed with lab work on the same day.
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
## 2020-09-14 NOTE — Assessment & Plan Note (Signed)
   Discussed importance of safe sexual practice to reduce risk of STI.  Condoms declined.  Menveo updated today.
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
## 2020-09-14 NOTE — Patient Instructions (Addendum)
Nice to see you.  We will check your lab work today.  Continue to take your medication daily.  Refills will be sent to the pharmacy.  Plan for follow up in 4 months or sooner if needed.   Hope things continue to get better.

## 2020-09-17 LAB — T-HELPER CELLS (CD4) COUNT (NOT AT ARMC)
Absolute CD4: 583 cells/uL (ref 490–1740)
CD4 T Helper %: 41 % (ref 30–61)
Total lymphocyte count: 1427 cells/uL (ref 850–3900)

## 2020-09-17 LAB — RPR: RPR Ser Ql: NONREACTIVE

## 2020-09-17 LAB — HIV-1 RNA QUANT-NO REFLEX-BLD
HIV 1 RNA Quant: NOT DETECTED Copies/mL
HIV-1 RNA Quant, Log: NOT DETECTED Log cps/mL

## 2020-09-27 ENCOUNTER — Encounter: Payer: Self-pay | Admitting: Family

## 2021-04-08 ENCOUNTER — Telehealth: Payer: Self-pay

## 2021-04-08 NOTE — Telephone Encounter (Signed)
Called patient in regards to mychart message sent in requesting appointments, left patient a voicemail to call us back to get these scheduled. ?

## 2021-04-16 ENCOUNTER — Telehealth: Payer: Self-pay

## 2021-04-16 NOTE — Telephone Encounter (Signed)
Left patient a voice mail to call back to schedule lab and financial appointment  ?

## 2021-07-24 ENCOUNTER — Telehealth: Payer: Self-pay | Admitting: Nurse Practitioner

## 2021-07-24 DIAGNOSIS — Z760 Encounter for issue of repeat prescription: Secondary | ICD-10-CM

## 2021-07-24 NOTE — Progress Notes (Signed)
Virtual Visit Consent   Frank Moore, you are scheduled for a virtual visit with a [facility] provider today. Just as with appointments in the office, your consent must be obtained to participate. Your consent will be active for this visit and any virtual visit you may have with one of our providers in the next 365 days. If you have a MyChart account, a copy of this consent can be sent to you electronically.  As this is a virtual visit, video technology does not allow for your provider to perform a traditional examination. This may limit your provider's ability to fully assess your condition. If your provider identifies any concerns that need to be evaluated in person or the need to arrange testing (such as labs, EKG, etc.), we will make arrangements to do so. Although advances in technology are sophisticated, we cannot ensure that it will always work on either your end or our end. If the connection with a video visit is poor, the visit may have to be switched to a telephone visit. With either a video or telephone visit, we are not always able to ensure that we have a secure connection.  By engaging in this virtual visit, you consent to the provision of healthcare and authorize for your insurance to be billed (if applicable) for the services provided during this visit. Depending on your insurance coverage, you may receive a charge related to this service.  I need to obtain your verbal consent now. Are you willing to proceed with your visit today? Frank Moore has provided verbal consent on 07/24/2021 for a virtual visit (video or telephone). Frank Simas, FNP  Date: 07/24/2021 2:01 PM  Virtual Visit via Video Note   I, Frank Moore, connected with  Frank Moore (41)  ([id number], Oct 30, 1980, 41) on 07/24/21 at  2:00 PM EDT by a video-enabled telemedicine application and verified that I am speaking with the correct person using two identifiers.  Location: Patient: Virtual Visit Location Patient:  Home Provider: Virtual Visit Location Provider: Home Office   I discussed the limitations of evaluation and management by telemedicine and the availability of in person appointments. The patient expressed understanding and agreed to proceed.    History of Present Illness: Frank Moore is a 41 y.o. who identifies as a male who was assigned male at birth, and is being seen today asking for assistance with getting set up to have his financial aid restarted. He is currently established with the [facility] group.    Problems:  Patient Active Problem List   Diagnosis Date Noted   Grief 03/30/2020   Situational anxiety 04/06/2019   Healthcare maintenance 10/06/2018   Erectile dysfunction 09/17/2017   Tinea versicolor 08/19/2016   Genital herpes 05/06/2016   Poor dentition 05/06/2016   Depression 01/11/2016   Unintentional weight loss 01/11/2016   Cigarette smoker 01/11/2016   HIV disease (HCC) 01/10/2016    Allergies: No Known Allergies Medications:  Current Outpatient Medications:    dolutegravir (TIVICAY) 50 MG tablet, TAKE 1 TABLET(50 MG) BY MOUTH DAILY, Disp: 30 tablet, Rfl: 5   emtricitabine-tenofovir AF (DESCOVY) 200-25 MG tablet, Take 1 tablet by mouth daily., Disp: 30 tablet, Rfl: 5   escitalopram (LEXAPRO) 10 MG tablet, Take 1 tablet (10 mg total) by mouth daily., Disp: 30 tablet, Rfl: 2   ibuprofen (ADVIL,MOTRIN) 200 MG tablet, Take 400 mg by mouth every 6 (six) hours as needed., Disp: , Rfl:    valACYclovir (VALTREX) 500 MG tablet, Take 1 tablet (500 mg total)  by mouth 2 (two) times daily., Disp: 60 tablet, Rfl: 2  Observations/Objective: Patient is well-developed, well-nourished in no acute distress.  Resting comfortably  at home.  Head is normocephalic, atraumatic.  No labored breathing.  Speech is clear and coherent with logical content.  Patient is alert and oriented at baseline.    Assessment and Plan: 1. Medication refill Patient will contact  infectious disease to assist with financial aid establishment- last Mychart message from their group indicated that he can call that office for assistance.   Patient agreeable to plan  Follow Up Instructions: I discussed the assessment and treatment plan with the patient. The patient was provided an opportunity to ask questions and all were answered. The patient agreed with the plan and demonstrated an understanding of the instructions.  A copy of instructions were sent to the patient via MyChart unless otherwise noted below.   The patient was advised to call back or seek an in-person evaluation if the symptoms worsen or if the condition fails to improve as anticipated.  Time:  I spent 5 minutes with the patient via telehealth technology discussing the above problems/concerns.    Frank Simas, FNP

## 2021-09-27 ENCOUNTER — Ambulatory Visit (INDEPENDENT_AMBULATORY_CARE_PROVIDER_SITE_OTHER): Payer: Self-pay | Admitting: Family

## 2021-09-27 ENCOUNTER — Other Ambulatory Visit: Payer: Self-pay

## 2021-09-27 ENCOUNTER — Ambulatory Visit: Payer: Self-pay

## 2021-09-27 ENCOUNTER — Encounter: Payer: Self-pay | Admitting: Family

## 2021-09-27 VITALS — BP 124/84 | HR 89 | Temp 98.2°F | Ht 71.0 in | Wt 169.0 lb

## 2021-09-27 DIAGNOSIS — F331 Major depressive disorder, recurrent, moderate: Secondary | ICD-10-CM

## 2021-09-27 DIAGNOSIS — Z113 Encounter for screening for infections with a predominantly sexual mode of transmission: Secondary | ICD-10-CM

## 2021-09-27 DIAGNOSIS — Z0000 Encounter for general adult medical examination without abnormal findings: Secondary | ICD-10-CM

## 2021-09-27 DIAGNOSIS — Z79899 Other long term (current) drug therapy: Secondary | ICD-10-CM

## 2021-09-27 DIAGNOSIS — B20 Human immunodeficiency virus [HIV] disease: Secondary | ICD-10-CM

## 2021-09-27 DIAGNOSIS — F17210 Nicotine dependence, cigarettes, uncomplicated: Secondary | ICD-10-CM

## 2021-09-27 MED ORDER — ESCITALOPRAM OXALATE 10 MG PO TABS: 10.0000 mg | ORAL_TABLET | Freq: Every day | ORAL | 2 refills | Status: AC

## 2021-09-27 MED ORDER — DESCOVY 200-25 MG PO TABS: 1.0000 | ORAL_TABLET | Freq: Every day | ORAL | 5 refills | Status: DC

## 2021-09-27 MED ORDER — TIVICAY 50 MG PO TABS: ORAL_TABLET | ORAL | 5 refills | Status: DC

## 2021-09-27 NOTE — Patient Instructions (Addendum)
Nice to see you.  We will check your lab work today.  Continue to take your medication daily as prescribed.  Refills have been sent to the pharmacy.  Plan for follow up in 1 months or sooner if needed with lab work on the same day.  Have a great day and stay safe!  

## 2021-09-27 NOTE — Assessment & Plan Note (Signed)
Frank Moore continues to smoke tobacco. Discussed importance of cessation to reduce risk of cardiovascular, respiratory and malignant disease in the future. In the pre-contemplation stage and not ready to quit.
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
## 2021-09-27 NOTE — Addendum Note (Signed)
Addended by: Tressa Busman T on: 09/27/2021 01:53 PM   Modules accepted: Orders

## 2021-09-27 NOTE — Assessment & Plan Note (Signed)
Frank Moore has poorly controlled virus secondary to being off medication for several months related to expiration of his financial assistance and depression. Reviewed previous lab work and discussed plan of care including how getting on and staying on medications can help stay undetectable and reduce risk of progression or complication of disease. Check lab work today including [place]. Sample of Biktarvy provided while awaiting UMAP approval. He can decide if he wishes to go back on Tivicay/Descovy or stay with Biktarvy. Plan for follow up in 1 month or sooner if needed with lab work on the same day.

## 2021-09-27 NOTE — Assessment & Plan Note (Signed)
   Discussed importance of safe sexual practice and condom use. Condoms and STD testing offered.  Declined vaccinations. 

## 2021-09-27 NOTE — Assessment & Plan Note (Signed)
Frank Moore appears to have had an exacerbation of his depression following additional life stressors. No current suicidal ideations. Recommend he schedule an appointment with counseling. Discussed available resources for him to seek care if his symptoms worsen or thoughts of suicide develop. Restart Lexapro.

## 2021-09-30 LAB — URINE CYTOLOGY ANCILLARY ONLY
Chlamydia: NEGATIVE
Comment: NEGATIVE
Comment: NORMAL
Neisseria Gonorrhea: NEGATIVE

## 2021-09-30 LAB — CYTOLOGY, (ORAL, ANAL, URETHRAL) ANCILLARY ONLY
Chlamydia: NEGATIVE
Comment: NEGATIVE
Comment: NORMAL
Neisseria Gonorrhea: NEGATIVE

## 2021-10-03 ENCOUNTER — Other Ambulatory Visit: Payer: Self-pay | Admitting: Pharmacist

## 2021-10-03 DIAGNOSIS — B20 Human immunodeficiency virus [HIV] disease: Secondary | ICD-10-CM

## 2021-10-03 MED ORDER — BIKTARVY 50-200-25 MG PO TABS: 1.0000 | ORAL_TABLET | Freq: Every day | ORAL | 0 refills | Status: AC

## 2021-10-03 NOTE — Progress Notes (Signed)
Medication Samples have been provided to the patient.  Drug name: Biktarvy        Strength: 50/200/25 mg       Qty: 14 tablets (2 bottles)   LOT: [id number]   Exp.Date: 10/2023  Dosing instructions: Take one tablet by mouth once daily  The patient has been instructed regarding the correct time, dose, and frequency of taking this medication, including desired effects and most common side effects.   Frank Moore, PharmD, BCIDP, AAHIVP, CPP Clinical Pharmacist Practitioner Infectious Diseases Clinical Pharmacist [facility] 01/16/2020, 10:07 AM  

## 2021-10-06 LAB — HIV-1 INTEGRASE GENOTYPE

## 2021-10-06 LAB — COMPREHENSIVE METABOLIC PANEL
AG Ratio: 1.6 (calc) (ref 1.0–2.5)
ALT: 42 U/L (ref 9–46)
AST: 32 U/L (ref 10–40)
Albumin: 4.7 g/dL (ref 3.6–5.1)
Alkaline phosphatase (APISO): 92 U/L (ref 36–130)
BUN: 15 mg/dL (ref 7–25)
CO2: 27 mmol/L (ref 20–32)
Calcium: 9.6 mg/dL (ref 8.6–10.3)
Chloride: 104 mmol/L (ref 98–110)
Creat: 1.03 mg/dL (ref 0.60–1.29)
Globulin: 2.9 g/dL (calc) (ref 1.9–3.7)
Glucose, Bld: 83 mg/dL (ref 65–99)
Potassium: 3.7 mmol/L (ref 3.5–5.3)
Sodium: 142 mmol/L (ref 135–146)
Total Bilirubin: 0.6 mg/dL (ref 0.2–1.2)
Total Protein: 7.6 g/dL (ref 6.1–8.1)

## 2021-10-06 LAB — T-HELPER CELLS (CD4) COUNT (NOT AT ARMC)
Absolute CD4: 586 cells/uL (ref 490–1740)
CD4 T Helper %: 37 % (ref 30–61)
Total lymphocyte count: 1585 cells/uL (ref 850–3900)

## 2021-10-06 LAB — CBC WITH DIFFERENTIAL/PLATELET
Absolute Monocytes: 598 cells/uL (ref 200–950)
Basophils Absolute: 31 cells/uL (ref 0–200)
Basophils Relative: 0.5 %
Eosinophils Absolute: 49 cells/uL (ref 15–500)
Eosinophils Relative: 0.8 %
HCT: 45.4 % (ref 38.5–50.0)
Hemoglobin: 14.5 g/dL (ref 13.2–17.1)
Lymphs Abs: 1690 cells/uL (ref 850–3900)
MCH: 26.3 pg — ABNORMAL LOW (ref 27.0–33.0)
MCHC: 31.9 g/dL — ABNORMAL LOW (ref 32.0–36.0)
MCV: 82.2 fL (ref 80.0–100.0)
MPV: 10.7 fL (ref 7.5–12.5)
Monocytes Relative: 9.8 %
Neutro Abs: 3733 cells/uL (ref 1500–7800)
Neutrophils Relative %: 61.2 %
Platelets: 243 10*3/uL (ref 140–400)
RBC: 5.52 10*6/uL (ref 4.20–5.80)
RDW: 13.2 % (ref 11.0–15.0)
Total Lymphocyte: 27.7 %
WBC: 6.1 10*3/uL (ref 3.8–10.8)

## 2021-10-06 LAB — HIV RNA, RTPCR W/R GT (RTI, PI,INT)
HIV 1 RNA Quant: 15700 copies/mL — ABNORMAL HIGH
HIV-1 RNA Quant, Log: 4.2 Log copies/mL — ABNORMAL HIGH

## 2021-10-06 LAB — LIPID PANEL
Cholesterol: 186 mg/dL (ref <200)
HDL: 53 mg/dL (ref >=40)
LDL Cholesterol (Calc): 117 mg/dL (calc) — ABNORMAL HIGH
Non-HDL Cholesterol (Calc): 133 mg/dL (calc) — ABNORMAL HIGH (ref <130)
Total CHOL/HDL Ratio: 3.5 (calc) (ref <5.0)
Triglycerides: 65 mg/dL (ref <150)

## 2021-10-06 LAB — RPR: RPR Ser Ql: NONREACTIVE

## 2021-10-06 LAB — HIV-1 GENOTYPE: HIV-1 Genotype: DETECTED — AB

## 2021-10-17 ENCOUNTER — Encounter: Payer: Self-pay | Admitting: Family

## 2021-10-20 ENCOUNTER — Other Ambulatory Visit: Payer: Self-pay | Admitting: Family

## 2021-10-20 DIAGNOSIS — B20 Human immunodeficiency virus [HIV] disease: Secondary | ICD-10-CM

## 2021-10-22 ENCOUNTER — Other Ambulatory Visit: Payer: Self-pay

## 2021-10-22 ENCOUNTER — Telehealth: Payer: Self-pay

## 2021-10-22 DIAGNOSIS — B20 Human immunodeficiency virus [HIV] disease: Secondary | ICD-10-CM

## 2021-10-22 MED ORDER — BIKTARVY 50-200-25 MG PO TABS: 1.0000 | ORAL_TABLET | Freq: Every day | ORAL | 2 refills | Status: DC

## 2021-10-22 NOTE — Telephone Encounter (Signed)
Thank you :)

## 2021-10-22 NOTE — Telephone Encounter (Signed)
Biktarvy sent to [facility] on [address]. Discontinued Tivicay/Descovy.  Leatrice Jewels, RMA

## 2021-10-22 NOTE — Telephone Encounter (Signed)
Called patient to schedule follow up appointment, call could not be completed.   Beryle Flock, RN

## 2022-01-07 ENCOUNTER — Telehealth: Payer: Self-pay

## 2022-01-07 NOTE — Telephone Encounter (Signed)
Detectable Viral Load Intervention   Most recent VL:  Lab Results  Component Value Date   HIV1RNAQUANT 15,700 (H) 09/27/2021     Current ART regimen: Biktarvy  Appointment status: patient does not have future appointment scheduled   Called patient to discuss medication adherence and possible barriers to care.    Interventions:  Staff not able to reach patient at this time. Left patient HIPAA compliant voicemail and will also send a Mychart message to check in with patient.  Valarie Cones, LPN

## 2022-04-09 ENCOUNTER — Telehealth: Payer: Self-pay

## 2022-04-09 NOTE — Telephone Encounter (Signed)
Detectable Viral Load Intervention   Most recent VL:  HIV 1 RNA Quant  Date Value Ref Range Status  09/27/2021 15,700 (H) copies/mL Final  09/14/2020 Not Detected Copies/mL Final  06/25/2020 Not Detected Copies/mL Final    Current ART regimen: BIKTARVY  Appointment status: patient does not have future appointment scheduled   Called patient to discuss medication adherence and possible barriers to care.    Interventions:  Call could not be completed, patient has not viewed previous MyChart message.   Tivicay and Descovy were dispensed 02/16/22, patient is currently being prescribed Biktarvy.   Beryle Flock, RN

## 2022-05-20 ENCOUNTER — Telehealth: Payer: Self-pay | Admitting: General Practice

## 2022-05-20 NOTE — Telephone Encounter (Signed)
Called pt to discuss applying for Medicaid and to schedule an appt, could not leave a msg no voicemail set up

## 2022-10-05 ENCOUNTER — Other Ambulatory Visit: Payer: Self-pay | Admitting: Family

## 2022-10-05 DIAGNOSIS — B20 Human immunodeficiency virus [HIV] disease: Secondary | ICD-10-CM

## 2022-10-19 ENCOUNTER — Other Ambulatory Visit: Payer: Self-pay | Admitting: Family

## 2022-10-19 DIAGNOSIS — B20 Human immunodeficiency virus [HIV] disease: Secondary | ICD-10-CM

## 2022-11-17 ENCOUNTER — Other Ambulatory Visit: Payer: Self-pay | Admitting: Family

## 2022-11-17 DIAGNOSIS — B20 Human immunodeficiency virus [HIV] disease: Secondary | ICD-10-CM

## 2023-06-02 NOTE — Progress Notes (Signed)
 The ASCVD Risk score (Arnett DK, et al., 2019) failed to calculate for the following reasons:   The systolic blood pressure is missing  Arlon Bergamo, BSN, Charity fundraiser

## 2023-06-10 ENCOUNTER — Telehealth: Payer: Self-pay

## 2023-06-10 NOTE — Telephone Encounter (Signed)
 Patient considered out of care.  Last [facility] Visit: 09-27-21  Last HIV Viral Load:  HIV 1 RNA Quant  Date Value Ref Range Status  09/27/2021 15,700 (H) copies/mL Final    Last CD4 Count:  CD4 T Cell Abs  Date Value Ref Range Status  06/25/2020 484 400 - 1,790 /uL Final    Medication Dispense History:   Dispensed Days Supply Quantity Provider Pharmacy  BIKTARVY  50/200/25MG  TABLETS 10/21/2022 30 30 each Calone, Gregory D, FNP [facility] DRUG STORE #...    Interventions: Flora Humphreys on both numbers listed in chart, no answer on either. Left HIPAA compliant voicemail requesting callback.   Will try reaching via MyChart.   Duration of Services: 10 minutes  Regla Fitzgibbon, BSN, Charity fundraiser

## 2023-07-08 ENCOUNTER — Telehealth: Payer: Self-pay

## 2023-07-08 NOTE — Telephone Encounter (Signed)
 Called Frank Moore to schedule an appointment, no answer. Left HIPAA compliant voicemail requesting callback.   Kaci Dillie, BSN, RN

## 2023-08-12 ENCOUNTER — Telehealth: Payer: Self-pay

## 2023-08-12 NOTE — Telephone Encounter (Signed)
 Called patient to schedule appointment, call could not be completed.  Called alternate number, no answer. Left HIPAA compliant voicemail requesting callback.   Evamaria Detore, BSN, RN

## 2023-09-28 ENCOUNTER — Telehealth: Payer: Self-pay

## 2023-09-28 NOTE — Telephone Encounter (Signed)
 Called patient to offer appointment, call could not be completed. Called alternate number, no answer. Left HIPAA compliant voicemail requesting callback.   Letter mailed to address on file.   Champayne Kocian, BSN, RN

## 2023-10-12 NOTE — Progress Notes (Signed)
 [facility] Counselor Referral Notice  Attempts to reach patient to re-engage in ID clinic care have not been successful. Patient is being referred to [facility] Counselor for assistance in re-engaging in care.   No recent updates in Care Everywhere. No results in [facility].   Last HIV Viral Load:  HIV 1 RNA Quant  Date Value Ref Range Status  09/27/2021 15,700 (H) copies/mL Final    Last CD4 Count:  CD4 T Cell Abs  Date Value Ref Range Status  06/25/2020 484 400 - 1,790 /uL Final    Last [facility] Visit: 09/27/21  Medication Dispense History:   Dispensed Days Supply Quantity Provider Pharmacy  BIKTARVY  50/200/25MG  TABLETS 10/21/2022 30 30 each Calone, Gregory D, FNP [facility] DRUG STORE #...    Dispensed Days Supply Quantity Provider Pharmacy  TIVICAY  50MG  TABLETS 09/01/2022 30 30 each Calone, Gregory D, FNP [facility] DRUG STORE #...  TIVICAY  50MG  TABLETS 07/02/2022 30 30 each Calone, Gregory D, FNP [facility] DRUG STORE #...  TIVICAY  50MG  TABLETS 05/14/2022 30 30 each Calone, Gregory D, FNP [facility] DRUG STORE #...    Dispensed Days Supply Quantity Provider Pharmacy  DESCOVY  200MG /25MG  TABLETS 09/01/2022 30 30 each Calone, Gregory D, FNP [facility] DRUG STORE #...  DESCOVY  200MG /25MG  TABLETS 07/02/2022 30 30 each Calone, Gregory D, FNP [facility] DRUG STORE #...  DESCOVY  200MG /25MG  TABLETS 05/14/2022 30 30 each Calone, Gregory D, FNP [facility] DRUG STORE #...    Phone Call Attempts:  Attempt Date  10/22/21  01/07/22  04/09/22  05/20/22  06/10/23  07/08/23  08/12/23  09/28/23    MyChart Attempts:  Attempt Date  10/22/21  01/07/22  06/10/23  07/08/23    Letter Mailed: 09/28/23   Duration of Service: 10 minutes  Duwaine Lowe, BSN, RN

## 2024-02-01 ENCOUNTER — Encounter: Payer: Self-pay | Admitting: Family

## 2024-02-01 ENCOUNTER — Telehealth: Payer: Self-pay

## 2024-02-01 ENCOUNTER — Other Ambulatory Visit (HOSPITAL_COMMUNITY): Payer: Self-pay

## 2024-02-01 ENCOUNTER — Other Ambulatory Visit: Payer: Self-pay

## 2024-02-01 ENCOUNTER — Ambulatory Visit (INDEPENDENT_AMBULATORY_CARE_PROVIDER_SITE_OTHER): Payer: Self-pay | Admitting: Family

## 2024-02-01 VITALS — BP 145/101 | HR 99 | Temp 99.3°F | Wt 210.0 lb

## 2024-02-01 DIAGNOSIS — B20 Human immunodeficiency virus [HIV] disease: Secondary | ICD-10-CM

## 2024-02-01 DIAGNOSIS — Z23 Encounter for immunization: Secondary | ICD-10-CM

## 2024-02-01 DIAGNOSIS — Z79899 Other long term (current) drug therapy: Secondary | ICD-10-CM

## 2024-02-01 DIAGNOSIS — Z0000 Encounter for general adult medical examination without abnormal findings: Secondary | ICD-10-CM

## 2024-02-01 MED ORDER — DOLUTEGRAVIR SODIUM 50 MG PO TABS: 50.0000 mg | ORAL_TABLET | Freq: Every day | ORAL | 3 refills | Status: AC

## 2024-02-01 MED ORDER — DESCOVY 200-25 MG PO TABS: 1.0000 | ORAL_TABLET | Freq: Every day | ORAL | 3 refills | Status: AC

## 2024-02-01 NOTE — Assessment & Plan Note (Signed)
 Discussed importance of safe sexual practice and condom use. Condoms and site specific STD testing offered.  Due for dental care Due for anal pap and will attempt at next visit.  Influenza and Prevnar 20 updated following counseling

## 2024-02-01 NOTE — Progress Notes (Signed)
 "   Brief Narrative   Patient ID: Frank Moore, male    DOB: 03-Jul-1980, 43 y.o.   MRN: [id number]  Frank Moore is a 43 y/o AA male diagnosed with HIV-1 disease on 01/01/16 with risk factor being MSM. Initial viral load of 821 with CD4 count of 580. Entered care in CDC Stage 1. Genotype with no significant resistance. No history of opportunistic infection. [id number] negative. ART history with Tivicay  and Descovy .    Subjective:   Chief Complaint  Patient presents with   Follow-up    B20    HPI:  Frank Moore is a 43 y.o. male with HIV disease last seen on 09/27/2021 with poorly controlled virus and less than optimal adherence to Tivicay  and Descovy .  Viral load was 15,700 with CD4 count 586.  Genotype with no significant medication resistant mutations.  Returns to care today following that office visit.  Frank Moore has been doing okay since his last office visit and has been off medication since his prescription ran out approximately 2 years ago.  He is currently taking care of his father who is suffering with dementia and is the primary caregiver assisting his mother.  Concerned about developing dementia as it runs in his family.  Overall feeling okay.  Wishing to get back into care and take care of himself.  Not currently employed.  Housing and access to food are stable.  Using his parents car for transportation as his transmission does not work on his car.  Has assistance with taking care of his father who goes to the pace program 3 to 4 days/week.  Healthcare maintenance reviewed.  Due for routine dental care.  Currently uninsured.  Denies fevers, chills, night sweats, headaches, changes in vision, neck pain/stiffness, nausea, diarrhea, vomiting, lesions or rashes.  Lab Results  Component Value Date   CD4TCELL 37 09/27/2021   CD4TABS 484 06/25/2020   Lab Results  Component Value Date   HIV1RNAQUANT 15,700 (H) 09/27/2021     Allergies[1]    Outpatient Medications Prior to Visit   Medication Sig Dispense Refill   escitalopram  (LEXAPRO ) 10 MG tablet Take 1 tablet (10 mg total) by mouth daily. 30 tablet 2   ibuprofen  (ADVIL ,MOTRIN ) 200 MG tablet Take 400 mg by mouth every 6 (six) hours as needed. (Patient not taking: Reported on 09/27/2021)     valACYclovir  (VALTREX ) 500 MG tablet Take 1 tablet (500 mg total) by mouth 2 (two) times daily. (Patient not taking: Reported on 09/27/2021) 60 tablet 2   bictegravir-emtricitabine -tenofovir  AF (BIKTARVY ) 50-200-25 MG TABS tablet Take 1 tablet by mouth daily. 30 tablet 2   No facility-administered medications prior to visit.     Past Medical History:  Diagnosis Date   Depression    HIV infection (HCC)      History reviewed. No pertinent surgical history.      Review of Systems  Constitutional:  Negative for appetite change, chills, fatigue, fever and unexpected weight change.  Eyes:  Negative for visual disturbance.  Respiratory:  Negative for cough, chest tightness, shortness of breath and wheezing.   Cardiovascular:  Negative for chest pain and leg swelling.  Gastrointestinal:  Negative for abdominal pain, constipation, diarrhea, nausea and vomiting.  Genitourinary:  Negative for dysuria, flank pain, frequency, genital sores, hematuria and urgency.  Skin:  Negative for rash.  Allergic/Immunologic: Negative for immunocompromised state.  Neurological:  Negative for dizziness and headaches.     Objective:   BP (!) 145/101   Pulse 99  Temp 99.3 F (37.4 C) (Oral)   Wt 210 lb (95.3 kg)   SpO2 98%   BMI 29.29 kg/m  Nursing note and vital signs reviewed.  Physical Exam Constitutional:      General: He is not in acute distress.    Appearance: He is well-developed.  Eyes:     Conjunctiva/sclera: Conjunctivae normal.  Cardiovascular:     Rate and Rhythm: Normal rate and regular rhythm.     Heart sounds: Normal heart sounds. No murmur heard.    No friction rub. No gallop.  Pulmonary:     Effort:  Pulmonary effort is normal. No respiratory distress.     Breath sounds: Normal breath sounds. No wheezing or rales.  Chest:     Chest wall: No tenderness.  Abdominal:     General: Bowel sounds are normal.     Palpations: Abdomen is soft.     Tenderness: There is no abdominal tenderness.  Musculoskeletal:     Cervical back: Neck supple.  Lymphadenopathy:     Cervical: No cervical adenopathy.  Skin:    General: Skin is warm and dry.     Findings: No rash.  Neurological:     Mental Status: He is alert and oriented to person, place, and time.          09/27/2021   11:49 AM 03/30/2020   11:45 AM 10/18/2019    3:29 PM 04/05/2019    3:23 PM 10/06/2018    4:25 PM  Depression screen PHQ 2/9  Decreased Interest 0 1 1 1  0  Down, Depressed, Hopeless 1 1 1 1  0  PHQ - 2 Score 1 2 2 2  0  Altered sleeping  1 1 2    Tired, decreased energy  1 0 3   Change in appetite  1 0 0   Feeling bad or failure about yourself   1 0 0   Trouble concentrating  1 0 0   Moving slowly or fidgety/restless  0 0 0   Suicidal thoughts  0 0 0   PHQ-9 Score  7  3  7     Difficult doing work/chores    Somewhat difficult      Data saved with a previous flowsheet row definition        04/05/2019    3:25 PM  GAD 7 : Generalized Anxiety Score  Nervous, Anxious, on Edge 1  Control/stop worrying 2  Worry too much - different things 2  Trouble relaxing 1  Restless 0  Easily annoyed or irritable 2  Afraid - awful might happen 0  Total GAD 7 Score 8  Anxiety Difficulty Somewhat difficult     The 10-year ASCVD risk score (Arnett DK, et al., 2019) is: 4.7%   Values used to calculate the score:     Age: 21 years     Clinically relevant sex: Male     Is Non-Hispanic African American: Yes     Diabetic: No     Tobacco smoker: No     Systolic Blood Pressure: 145 mmHg     Is BP treated: No     HDL Cholesterol: 53 mg/dL     Total Cholesterol: 186 mg/dL      Assessment & Plan:    Patient Active Problem List    Diagnosis Date Noted   HIV disease (HCC) 01/10/2016    Priority: High   Grief 03/30/2020   Situational anxiety 04/06/2019   Healthcare maintenance 10/06/2018   Erectile dysfunction 09/17/2017  Tinea versicolor 08/19/2016   Genital herpes 05/06/2016   Poor dentition 05/06/2016   Depression 01/11/2016   Unintentional weight loss 01/11/2016   Cigarette smoker 01/11/2016     Problem List Items Addressed This Visit       Other   HIV disease (HCC) - Primary (Chronic)   Frank Moore has poorly controlled virus secondary to being out of care for the last 2 years related to having to care for his father at home with dementia.  Currently uninsured and will meet with financial planning to establish Medicaid or U MAP.  Fortunately no signs/symptoms of opportunistic infection or progressive HIV disease.  Discussed importance of routine follow-up to reduce risk of disease progression and complications in the future.  Had some nausea with Biktarvy  and would like to continue with Tivicay  and Descovy  which is reasonable.  Unfortunately do not have a sample to provide and will start on Dovato to bridge to Tivicay /Descovy .  Check blood work.  Plan for follow-up in 1 month or sooner if needed with lab work on the same day.      Relevant Medications   dolutegravir  (TIVICAY ) 50 MG tablet   emtricitabine -tenofovir  AF (DESCOVY ) 200-25 MG tablet   Other Relevant Orders   Comprehensive metabolic panel with GFR   HIV-1 RNA quant-no reflex-bld   T-helper cell (CD4)- ([facility] clinic only)   Pneumococcal conjugate vaccine 20-valent (Completed)   Flu vaccine trivalent PF, 6mos and older(Flulaval,Afluria,Fluarix,Fluzone) (Completed)   Healthcare maintenance   Discussed importance of safe sexual practice and condom use. Condoms and site specific STD testing offered.  Due for dental care Due for anal pap and will attempt at next visit.  Influenza and Prevnar 20 updated following counseling       Other Visit  Diagnoses       Need for pneumococcal 20-valent conjugate vaccination       Relevant Orders   Pneumococcal conjugate vaccine 20-valent (Completed)   Flu vaccine trivalent PF, 6mos and older(Flulaval,Afluria,Fluarix,Fluzone) (Completed)     Need for influenza vaccination       Relevant Orders   Pneumococcal conjugate vaccine 20-valent (Completed)   Flu vaccine trivalent PF, 6mos and older(Flulaval,Afluria,Fluarix,Fluzone) (Completed)        I have discontinued Frank Moore's Biktarvy . I am also having him start on dolutegravir  and Descovy . Additionally, I am having him maintain his ibuprofen , valACYclovir , and escitalopram .   Meds ordered this encounter  Medications   dolutegravir  (TIVICAY ) 50 MG tablet    Sig: Take 1 tablet (50 mg total) by mouth daily.    Dispense:  30 tablet    Refill:  3    UMAP pending    Supervising Provider:   SNIDER, CYNTHIA [[id number]]   emtricitabine -tenofovir  AF (DESCOVY ) 200-25 MG tablet    Sig: Take 1 tablet by mouth daily.    Dispense:  30 tablet    Refill:  3    UMAP Pending    Supervising Provider:   LUIZ CHANNEL [[id number]]     Follow-up: Return in about 1 month (around 03/03/2024). or sooner if needed.    Cathlyn July, MSN, FNP-C Nurse Practitioner [facility] Main number: [phone]      [1] No Known Allergies  "

## 2024-02-01 NOTE — Assessment & Plan Note (Signed)
 Frank Moore has poorly controlled virus secondary to being out of care for the last 2 years related to having to care for his father at home with dementia.  Currently uninsured and will meet with financial planning to establish Medicaid or U MAP.  Fortunately no signs/symptoms of opportunistic infection or progressive HIV disease.  Discussed importance of routine follow-up to reduce risk of disease progression and complications in the future.  Had some nausea with Biktarvy  and would like to continue with Tivicay  and Descovy  which is reasonable.  Unfortunately do not have a sample to provide and will start on Dovato to bridge to Tivicay /Descovy .  Check blood work.  Plan for follow-up in 1 month or sooner if needed with lab work on the same day.

## 2024-02-01 NOTE — Patient Instructions (Addendum)
 Nice to see you.  We will check your lab work today.  Continue to take your medication daily as prescribed.  Refills have been sent to the pharmacy.  Plan for follow up in 1 months or sooner if needed with lab work on the same day.  Have a great day and stay safe!  Mental Health Resources  988: can call or text 24/7  [facility]: Address: [address] [place], [place] [address] Open 24 hours Phone: [phone]  [facility]: Address: [address] [place], [place] [address] Phone: [phone] Appointments: [email or web address]

## 2024-02-01 NOTE — Telephone Encounter (Signed)
 [facility] Patient Advocate Encounter  Insurance verification completed.    The patient is insured through [employer].     Ran test claim for SYMTUZA  The current 30 day co-pay is $0.  Ran test claim for DOVATO  The current 30 day co-pay is $0.  Ran test claim for BIKTARVY   The current 30 day co-pay is $15.   We will continue to follow to see if copay assistance is needed.  This test claim was processed through [facility]- copay amounts may vary at other pharmacies due to pharmacy/plan contracts, or as the patient moves through the different stages of their insurance plan.

## 2024-02-02 LAB — T-HELPER CELL (CD4) - (RCID CLINIC ONLY)
CD4 % Helper T Cell: 23 % — ABNORMAL LOW (ref 33–65)
CD4 T Cell Abs: 392 /uL — ABNORMAL LOW (ref 400–1790)

## 2024-02-03 ENCOUNTER — Other Ambulatory Visit: Payer: Self-pay | Admitting: Pharmacist

## 2024-02-03 DIAGNOSIS — B20 Human immunodeficiency virus [HIV] disease: Secondary | ICD-10-CM

## 2024-02-03 LAB — COMPREHENSIVE METABOLIC PANEL WITH GFR
AG Ratio: 1.4 (calc) (ref 1.0–2.5)
ALT: 36 U/L (ref 9–46)
AST: 25 U/L (ref 10–40)
Albumin: 4.5 g/dL (ref 3.6–5.1)
Alkaline phosphatase (APISO): 97 U/L (ref 36–130)
BUN: 11 mg/dL (ref 7–25)
CO2: 29 mmol/L (ref 20–32)
Calcium: 9.3 mg/dL (ref 8.6–10.3)
Chloride: 104 mmol/L (ref 98–110)
Creat: 0.91 mg/dL (ref 0.60–1.29)
Globulin: 3.2 g/dL (ref 1.9–3.7)
Glucose, Bld: 106 mg/dL — ABNORMAL HIGH (ref 65–99)
Potassium: 3.6 mmol/L (ref 3.5–5.3)
Sodium: 141 mmol/L (ref 135–146)
Total Bilirubin: 0.5 mg/dL (ref 0.2–1.2)
Total Protein: 7.7 g/dL (ref 6.1–8.1)
eGFR: 107 mL/min/1.73m2

## 2024-02-03 LAB — HIV-1 RNA QUANT-NO REFLEX-BLD
HIV 1 RNA Quant: 12500 {copies}/mL — ABNORMAL HIGH
HIV-1 RNA Quant, Log: 4.1 {Log_copies}/mL — ABNORMAL HIGH

## 2024-02-03 MED ORDER — DOVATO 50-300 MG PO TABS: 1.0000 | ORAL_TABLET | Freq: Every day | ORAL | Status: AC

## 2024-02-03 NOTE — Progress Notes (Signed)
 Medication Samples have been provided to the patient.  Drug name: Biktarvy         Strength: 50/200/25 mg       Qty: 1 bottle (14 tablets)   LOT: [id number]   Exp.Date: 09/02/25  Samples requested by Cathlyn July, NP.  Dosing instructions: Take one tablet by mouth once daily  The patient has been instructed regarding the correct time, dose, and frequency of taking this medication, including desired effects and most common side effects.   Fatin Bachicha L. Emryn Flanery, PharmD, BCIDP, AAHIVP, CPP Clinical Pharmacist Practitioner Infectious Diseases Clinical Pharmacist [facility]

## 2024-02-08 ENCOUNTER — Ambulatory Visit: Payer: Self-pay | Admitting: Family

## 2024-02-15 ENCOUNTER — Other Ambulatory Visit (HOSPITAL_COMMUNITY): Payer: Self-pay

## 2024-02-15 NOTE — Telephone Encounter (Signed)
 Called [facility] to make sure they had UMAP info on file, on hold for 32 minutes without an answer. Financial counselor will check on UMAP status.   Frank Moore, BSN, RN

## 2024-02-16 NOTE — Telephone Encounter (Signed)
 Unable to get in touch with [facility], faxed [facility]MAP info to [facility] on [address].   Lakesia Dahle, BSN, RN

## 2024-02-16 NOTE — Telephone Encounter (Signed)
 Attempted to call [facility] x 2. Placed on hold and call dropped both times.   Hairo Garraway, BSN, RN

## 2024-03-03 ENCOUNTER — Ambulatory Visit: Payer: Self-pay | Admitting: Family

## 2024-03-09 ENCOUNTER — Telehealth: Payer: Self-pay

## 2024-03-09 NOTE — Telephone Encounter (Signed)
 Detectable Viral Load Intervention (DVL)  Most recent VL:  HIV 1 RNA Quant  Date Value Ref Range Status  02/01/2024 12,500 (H) NOT DETECTED copies/mL Final  09/27/2021 15,700 (H) copies/mL Final  09/14/2020 Not Detected Copies/mL Final    Last Clinic Visit: 02/01/24  Current ART regimen: Biktarvy   Appointment status: patient does not have future appointment scheduled   Medication last dispensed (per chart review):   Medication Adherence   Not able to assess    Barriers to Care  Not able to assess   Interventions   Called patient to discuss medication adherence and possible barriers to care.  Not able to assess at this time. Lorenda CHRISTELLA Code, RMA
